# Patient Record
Sex: Male | Born: 1955
Health system: Southern US, Community
[De-identification: ages and names within clinical notes are randomized; demographics above are authoritative.]

## PROBLEM LIST (undated history)

## (undated) DIAGNOSIS — E7800 Pure hypercholesterolemia, unspecified: Secondary | ICD-10-CM

## (undated) DIAGNOSIS — G8929 Other chronic pain: Secondary | ICD-10-CM

## (undated) DIAGNOSIS — Z860101 Personal history of adenomatous and serrated colon polyps: Secondary | ICD-10-CM

## (undated) DIAGNOSIS — N529 Male erectile dysfunction, unspecified: Secondary | ICD-10-CM

## (undated) DIAGNOSIS — S060X9A Concussion with loss of consciousness of unspecified duration, initial encounter: Secondary | ICD-10-CM

## (undated) DIAGNOSIS — N401 Enlarged prostate with lower urinary tract symptoms: Secondary | ICD-10-CM

## (undated) DIAGNOSIS — Z9889 Other specified postprocedural states: Secondary | ICD-10-CM

## (undated) DIAGNOSIS — E785 Hyperlipidemia, unspecified: Secondary | ICD-10-CM

## (undated) DIAGNOSIS — I10 Essential (primary) hypertension: Secondary | ICD-10-CM

## (undated) DIAGNOSIS — M1990 Unspecified osteoarthritis, unspecified site: Secondary | ICD-10-CM

## (undated) DIAGNOSIS — E119 Type 2 diabetes mellitus without complications: Secondary | ICD-10-CM

## (undated) DIAGNOSIS — B1920 Unspecified viral hepatitis C without hepatic coma: Secondary | ICD-10-CM

## (undated) DIAGNOSIS — S199XXA Unspecified injury of neck, initial encounter: Secondary | ICD-10-CM

## (undated) HISTORY — PX: COLONOSCOPY: SHX174

## (undated) HISTORY — DX: Pure hypercholesterolemia, unspecified: E78.00

## (undated) HISTORY — DX: Unspecified viral hepatitis C without hepatic coma: B19.20

## (undated) HISTORY — PX: FINGER SURGERY: SHX640

## (undated) HISTORY — PX: PROSTATE BIOPSY: SHX241

---
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
## 1995-03-18 HISTORY — PX: REVISION AMPUTATION OF FINGER: SHX2346

## 2001-06-28 ENCOUNTER — Ambulatory Visit (HOSPITAL_COMMUNITY): Admission: RE | Admit: 2001-06-28 | Discharge: 2001-06-28 | Payer: Self-pay | Admitting: Family Medicine

## 2001-06-28 ENCOUNTER — Encounter: Payer: Self-pay | Admitting: Family Medicine

## 2001-07-05 ENCOUNTER — Encounter: Payer: Self-pay | Admitting: Family Medicine

## 2001-07-05 ENCOUNTER — Ambulatory Visit (HOSPITAL_COMMUNITY): Admission: RE | Admit: 2001-07-05 | Discharge: 2001-07-05 | Payer: Self-pay | Admitting: Family Medicine

## 2004-07-25 ENCOUNTER — Ambulatory Visit (HOSPITAL_COMMUNITY): Admission: RE | Admit: 2004-07-25 | Discharge: 2004-07-25 | Payer: Self-pay | Admitting: Family Medicine

## 2005-03-17 DIAGNOSIS — Z8619 Personal history of other infectious and parasitic diseases: Secondary | ICD-10-CM

## 2005-03-17 HISTORY — DX: Personal history of other infectious and parasitic diseases: Z86.19

## 2005-11-18 ENCOUNTER — Ambulatory Visit (HOSPITAL_COMMUNITY): Admission: RE | Admit: 2005-11-18 | Discharge: 2005-11-18 | Payer: Self-pay | Admitting: Family Medicine

## 2005-12-02 ENCOUNTER — Ambulatory Visit: Payer: Self-pay | Admitting: Gastroenterology

## 2005-12-02 ENCOUNTER — Ambulatory Visit (HOSPITAL_COMMUNITY): Admission: RE | Admit: 2005-12-02 | Discharge: 2005-12-02 | Payer: Self-pay | Admitting: Gastroenterology

## 2005-12-30 ENCOUNTER — Ambulatory Visit: Payer: Self-pay | Admitting: Gastroenterology

## 2006-02-04 ENCOUNTER — Ambulatory Visit (HOSPITAL_COMMUNITY): Admission: RE | Admit: 2006-02-04 | Discharge: 2006-02-04 | Payer: Self-pay | Admitting: Gastroenterology

## 2006-02-04 ENCOUNTER — Encounter (INDEPENDENT_AMBULATORY_CARE_PROVIDER_SITE_OTHER): Payer: Self-pay | Admitting: Specialist

## 2006-06-09 ENCOUNTER — Ambulatory Visit: Payer: Self-pay | Admitting: Gastroenterology

## 2006-07-23 ENCOUNTER — Ambulatory Visit: Payer: Self-pay | Admitting: Gastroenterology

## 2006-07-30 ENCOUNTER — Ambulatory Visit: Payer: Self-pay | Admitting: Gastroenterology

## 2006-08-13 ENCOUNTER — Ambulatory Visit: Payer: Self-pay | Admitting: Gastroenterology

## 2006-09-03 ENCOUNTER — Ambulatory Visit: Payer: Self-pay | Admitting: Gastroenterology

## 2006-09-15 ENCOUNTER — Ambulatory Visit: Payer: Self-pay | Admitting: Gastroenterology

## 2006-10-15 ENCOUNTER — Ambulatory Visit: Payer: Self-pay | Admitting: Gastroenterology

## 2006-11-11 ENCOUNTER — Ambulatory Visit: Payer: Self-pay | Admitting: Gastroenterology

## 2006-12-10 ENCOUNTER — Ambulatory Visit: Payer: Self-pay | Admitting: Gastroenterology

## 2007-01-12 ENCOUNTER — Ambulatory Visit: Payer: Self-pay | Admitting: Gastroenterology

## 2007-01-12 ENCOUNTER — Ambulatory Visit (HOSPITAL_COMMUNITY): Admission: RE | Admit: 2007-01-12 | Discharge: 2007-01-12 | Payer: Self-pay | Admitting: Gastroenterology

## 2007-02-09 ENCOUNTER — Ambulatory Visit: Payer: Self-pay | Admitting: Gastroenterology

## 2007-03-09 ENCOUNTER — Ambulatory Visit: Payer: Self-pay | Admitting: Gastroenterology

## 2007-04-15 ENCOUNTER — Ambulatory Visit: Payer: Self-pay | Admitting: Gastroenterology

## 2007-04-22 ENCOUNTER — Ambulatory Visit: Payer: Self-pay | Admitting: Gastroenterology

## 2007-05-20 ENCOUNTER — Ambulatory Visit: Payer: Self-pay | Admitting: Gastroenterology

## 2007-06-17 ENCOUNTER — Ambulatory Visit: Payer: Self-pay | Admitting: Gastroenterology

## 2007-07-15 ENCOUNTER — Ambulatory Visit: Payer: Self-pay | Admitting: Gastroenterology

## 2007-08-19 ENCOUNTER — Ambulatory Visit: Payer: Self-pay | Admitting: Gastroenterology

## 2007-09-16 ENCOUNTER — Ambulatory Visit: Payer: Self-pay | Admitting: Gastroenterology

## 2007-10-14 ENCOUNTER — Ambulatory Visit: Payer: Self-pay | Admitting: Gastroenterology

## 2007-11-18 ENCOUNTER — Ambulatory Visit: Payer: Self-pay | Admitting: Gastroenterology

## 2009-07-10 ENCOUNTER — Ambulatory Visit: Payer: Self-pay | Admitting: Orthopedic Surgery

## 2009-07-10 DIAGNOSIS — M67919 Unspecified disorder of synovium and tendon, unspecified shoulder: Secondary | ICD-10-CM | POA: Insufficient documentation

## 2009-07-10 DIAGNOSIS — M719 Bursopathy, unspecified: Secondary | ICD-10-CM

## 2009-07-24 ENCOUNTER — Ambulatory Visit: Payer: Self-pay | Admitting: Orthopedic Surgery

## 2009-07-24 DIAGNOSIS — M75120 Complete rotator cuff tear or rupture of unspecified shoulder, not specified as traumatic: Secondary | ICD-10-CM | POA: Insufficient documentation

## 2009-07-25 ENCOUNTER — Telehealth: Payer: Self-pay | Admitting: Orthopedic Surgery

## 2009-07-27 ENCOUNTER — Ambulatory Visit (HOSPITAL_COMMUNITY): Admission: RE | Admit: 2009-07-27 | Discharge: 2009-07-27 | Payer: Self-pay | Admitting: Orthopedic Surgery

## 2009-08-02 ENCOUNTER — Ambulatory Visit: Payer: Self-pay | Admitting: Orthopedic Surgery

## 2009-08-06 ENCOUNTER — Encounter (HOSPITAL_COMMUNITY): Admission: RE | Admit: 2009-08-06 | Discharge: 2009-09-05 | Payer: Self-pay | Admitting: Orthopedic Surgery

## 2009-08-06 ENCOUNTER — Encounter: Payer: Self-pay | Admitting: Orthopedic Surgery

## 2009-09-24 ENCOUNTER — Encounter: Payer: Self-pay | Admitting: Orthopedic Surgery

## 2010-02-19 ENCOUNTER — Ambulatory Visit: Payer: Self-pay | Admitting: Orthopedic Surgery

## 2010-02-19 DIAGNOSIS — S43499A Other sprain of unspecified shoulder joint, initial encounter: Secondary | ICD-10-CM | POA: Insufficient documentation

## 2010-02-19 DIAGNOSIS — S46819A Strain of other muscles, fascia and tendons at shoulder and upper arm level, unspecified arm, initial encounter: Secondary | ICD-10-CM

## 2010-04-07 ENCOUNTER — Encounter: Payer: Self-pay | Admitting: Gastroenterology

## 2010-04-18 NOTE — Miscellaneous (Signed)
Summary: OT Clinical evaluation  OT Clinical evaluation   Imported By: Jacklynn Ganong 08/30/2009 15:01:46  _____________________________________________________________________  External Attachment:    Type:   Image     Comment:   External Document

## 2010-04-18 NOTE — Assessment & Plan Note (Signed)
Summary: CONSULT/TREAT LT SHOULDER PAIN/?RC TEAR/NEEDS XRAY/REF M.CRES...   Vital Signs:  Patient profile:   55 year old male Height:      74 inches Weight:      215 pounds Pulse rate:   68 / minute Resp:     16 per minute  Vitals Entered By: Fuller Canada MD (July 10, 2009 2:02 PM)  Visit Type:  Initial Consult Referring Provider:  Dr. Nobie Putnam Primary Provider:  Dr. Nobie Putnam  CC:  left shoulder pain.  History of Present Illness: 55 year old male works at [employer] presents with sharp throbbing stabbing burning pain in the LEFT shoulder for 6 months.  Came on gradually worse when he sleeping at try to lift his arm.  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  He does get some relief from Vicodin 5 mg.  Pain level is 6/10.  Will have xrays today.  Meds: Folic Acid, Beta Carotene, B6, Selenium, Fish iol, Saw Palmetto, Hydrocodone 5/500.      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 Cardiovascular:  Denies chest pain, palpitations, fainting, and murmurs. Respiratory:  Denies short of breath, wheezing, couch, tightness, pain on inspiration, and snoring . Gastrointestinal:  Denies heartburn, nausea, vomiting, diarrhea, constipation, and blood in your stools. Genitourinary:  Denies frequency, urgency, difficulty urinating, painful urination, flank pain, and bleeding in urine. Neurologic:  Complains of numbness and tingling; denies unsteady gait, dizziness, tremors, and seizure. Musculoskeletal:  Complains of joint pain and stiffness; denies swelling, instability, redness, heat, and muscle pain. Endocrine:  Denies excessive thirst,  exessive urination, and heat or cold intolerance. Psychiatric:  Denies nervousness, depression, anxiety, and hallucinations. Skin:  Denies changes in the skin, poor healing, rash, itching, and redness. HEENT:  Denies blurred or double vision, eye pain, redness, and watering. Immunology:  Denies seasonal allergies, sinus problems, and allergic to bee stings. Hemoatologic:  Denies easy bleeding and brusing.  Physical Exam  Additional Exam:  GEN: well developed, well nourished, normal grooming and hygiene, no deformity and normal body habitus.   CDV: pulses are normal, no edema, no erythema. no tenderness  Lymph: normal lymph nodes   Skin: no rashes, skin lesions or open sores   NEURO: normal coordination, reflexes, sensation.   Psyche: awake, alert and oriented. Mood normal   Gait: normal  RIGHT shoulder inspection normal, range of motion normal, motor exam normal, dry stability normal,  LEFT shoulder a.c. joint nontender posterior subacromial space tender anterior lateral tenderness in the deltoid area.  Full range of motion but painful impingement sign.  Weakness of Rotation normal strength in internal rotation normal strength in abduction and forward elevation.  Joint was stable      Impression & Recommendations:  Problem # 1:  ROTATOR CUFF SYNDROME, LEFT (ICD-726.10) Assessment New  Orders: Consultation Level III (62130) Joint Aspirate / Injection, Large (20610)/LEFT shoulder Depo- Medrol 40mg  (J1030) Verbal consent obtained/The shoulder was injected with depomedrol 40mg /cc and sensorcaine .25% . There were no complications   X-rays were ordered to views LEFT shoulder  Type II acromion normal glenohumeral joint normal a.c. joint.  Impression normal shoulder  Medications Added to Medication List This Visit: 1)  Norco  5-325 Mg Tabs (Hydrocodone-acetaminophen) .Marland Kitchen.. 1 by mouth q 4 as needed pain  Patient Instructions: 1)  You have received an injection of cortisone  today. You may experience increased pain at the injection site. Apply ice pack to the area for 20 minutes every 2 hours and take 2 xtra strength tylenol every 8 hours. This increased pain will usually resolve in 24 hours. The injection will take effect in 3-10 days.  2)  Please schedule a follow-up appointment in 2 weeks. Prescriptions: NORCO 5-325 MG TABS (HYDROCODONE-ACETAMINOPHEN) 1 by mouth q 4 as needed pain  #84 x 2   Entered and Authorized by:   Fuller Canada MD   Signed by:   Fuller Canada MD on 07/10/2009   Method used:   Print then Give to Patient   RxID:   [id number]

## 2010-04-18 NOTE — Miscellaneous (Signed)
Summary: Discharge from OT  Discharge from OT   Imported By: Jacklynn Ganong 09/26/2009 12:15:36  _____________________________________________________________________  External Attachment:    Type:   Image     Comment:   External Document

## 2010-04-18 NOTE — Progress Notes (Signed)
Summary: MRI appointment  Phone Note Outgoing Call   Call placed by: Waldon Reining,  Jul 25, 2009 4:16 PM Call placed to: Patient Action Taken: Appt scheduled Summary of Call: I scheduled patient's MRI appointment at [facility] on 07-27-09 at 1:30. Patient has BCBS, no precert is needed. Patient will follow up here for his results.

## 2010-04-18 NOTE — Letter (Signed)
Summary: *Orthopedic Consult Note  [facility]  [address] [place] [address]  [place], [place] [address]   Phone: [phone]  Fax: [phone]    Re:    Mitchell Ross DOB:    1956/02/16   Dear: Loraine Leriche   Thank you for requesting that we see the above patient for consultation.  A copy of the detailed office note will be sent under separate cover, for your review.  Evaluation today is consistent with:  1)  ROTATOR CUFF SYNDROME, LEFT (ICD-726.10)   Our recommendation is for: injection subacromial space followup 2 weeks continue Norco 5 mg q.4 p.r.n. pain #84 2 refills      Thank you for this opportunity to look after your patient.  Sincerely,   Terrance Mass. MD.

## 2010-04-18 NOTE — Assessment & Plan Note (Signed)
Summary: 2 WK RE-CK LT SHOULDER/ANTHEM BCBS/CAF   Visit Type:  Follow-up Referring Provider:  Dr. Nobie Putnam Primary Provider:  Dr. Nobie Putnam  CC:  left shoulder pain.  History of Present Illness: I saw Mitchell Ross in the office today for a 2 week  followup visit.  He is a 55 years old man with the complaint of:  left shoulder.  Meds: Folic Acid, Beta Carotene, B6, Selenium, Fish iol, Saw Palmetto, Hydrocodone 5/500.  Last visit, he was given: Norco 5-325 Mg Tabs (Hydrocodone-acetaminophen) .Marland Kitchen.. 1 by mouth q 4 as needed pain Injection in left shoulder.  He says that it started getting better, but now it hurts different, deeper pain.  He continues to have a positive impingement sign.  His range of motion is good but he has pain at 120-180 range of motion.  He has pain with internal rotation.  Shoulder is stable.  His neurovascular intact.  Skin normal.  There is no lymphadenopathy.     Allergies: No Known Drug Allergies  Past History:  Past medical, surgical, family and social histories (including risk factors) reviewed, and no changes noted (except as noted below).  Past Medical History: Reviewed history from 07/10/2009 and no changes required. na  Past Surgical History: Reviewed history from 07/10/2009 and no changes required. rt knee finger  Family History: Reviewed history from 07/10/2009 and no changes required. FH of Cancer:   Social History: Reviewed history from 07/10/2009 and no changes required. 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 Patient Level III (13244)  Problem # 2:  RUPTURE ROTATOR CUFF (ICD-727.61) Assessment: New  MRI rule out rotator cuff tear  Orders: Est. Patient Level III (01027)  Patient Instructions: 1)  MRI LEFT SHOULDER

## 2010-04-18 NOTE — Letter (Signed)
Summary: Generic Letter  [facility]  [address] [place] [address]  [place], [place] [address]   Phone: [phone]  Fax: [phone]    08/02/2009  TRAJON ROSETE [address] [place], [place]  [address]  Dear Mr. CHOYCE,  As per appointment/evaluation today, 08/02/2009:  Per MRI: The diagnosis is rotator cuff tear, left shoulder  (Diagnosis codes: 727.61, 726.10)       Sincerely,   Terrance Mass, MD

## 2010-04-18 NOTE — Assessment & Plan Note (Signed)
Summary: REQ INJECTION/BCBS/BSF   Visit Type:  Follow-up Referring Provider:  Dr. Nobie Putnam Primary Provider:  Dr. Nobie Putnam  CC:  left shoulder pain.  History of Present Illness: This is a 55 year old male previously seen for shoulder pain on the LEFT ventricle. He had an MRI, which showed rotator cuff tendinopathy with partial with tear of the supraspinatus, a.c. joint arthrosis, and bursitis. We advised him to have surgery. We wanted to try nonoperative treatment and had an injection or 2 , physical therapy,  and took pain medication.  He was in his normal state of health until 2 weeks ago, while at work. He felt a pop in the front of his shoulder and pain. He was evaluated by the company nurse and a physical therapist. He was placed on an exercise program, and no further followup was advised.  He presents now with anterior shoulder pain. A catching sensation in the LEFT shoulder and pain depending on the position of the arm  Norco 5 for pain, ran out, helps, Folic Acid, Beta Carotene, B6, Selenium, Fish iol, Saw Palmetto.           Allergies (verified): No Known Drug Allergies  Physical Exam  Additional Exam:  His overall appearance was normal. He was awake, alert, and limited x3. His mood and affect were pleasant.  He had normal ambulation.  His LEFT shoulder was tender anteriorly over the biceps tendon and coracoid. He did not have anterolateral pain. His range of motion was decreased in for elevation and internal rotation normal and external rotation. The shoulder was stable. He did have weakness of his supraspinatus tendon. He also had a positive speeds test for SLAP tear. He had some weakness in his cuff and the skin was normal. Pulses were excellent. There is no lymphadenopathy. Normal sensation was noted in the hand.    Impression & Recommendations:  Problem # 1:  RUPTURE ROTATOR CUFF (ICD-727.61) Assessment Unchanged  his rotator cuff problem seems the same. He  seems to have a new injury. Now.  LEFT shoulder injection  Verbal consent was obtained. The knee was prepped with alcohol and ethyl chloride. 1 cc of depomedrol 40mg /cc and 4 cc of lidocaine 1% was injected. there were no complications.  Orders: Est. Patient Level III ([address])  Problem # 2:  SPRAIN&STRAIN OTH SPEC SITES SHOULDER&UPPER ARM (ICD-840.8) Assessment: New  he seems to have a new SLAP tear. Recommend he see the company doctor or nurse, again to advise them that we think this is a new injury, and we've advised MRI to evaluate this. We can compare this to his previous MRI to confirm pathology going on in his shoulder.  Orders: Est. Patient Level III (60454) Joint Aspirate / Injection, Large (20610) Depo- Medrol 40mg  (J1030)  Patient Instructions: 1)  You have received an injection of cortisone today. You may experience increased pain at the injection site. Apply ice pack to the area for 20 minutes every 2 hours and take 2 xtra strength tylenol every 8 hours. This increased pain will usually resolve in 24 hours. The injection will take effect in 3-10 days.  2)  Please contact the nurse at [facility] to advise that I need to talk to her as this is a different injury  Prescriptions: NORCO 5-325 MG TABS (HYDROCODONE-ACETAMINOPHEN) 1 by mouth q 4 as needed pain  #84 x 2   Entered and Authorized by:   Fuller Canada MD   Signed by:   Fuller Canada MD on 02/19/2010  Method used:   Print then Give to Patient   RxID:   [id number]    Orders Added: 1)  Est. Patient Level III [84132] 2)  Joint Aspirate / Injection, Large [20610] 3)  Depo- Medrol 40mg  [J1030]

## 2010-04-18 NOTE — Letter (Signed)
Summary: History form  History form   Imported By: Jacklynn Ganong 07/16/2009 10:36:38  _____________________________________________________________________  External Attachment:    Type:   Image     Comment:   External Document

## 2010-04-18 NOTE — Assessment & Plan Note (Signed)
Summary: REVIEW MRI RESULTS [facility] BCBS/CAF   Visit Type:  Follow-up Referring Provider:  Dr. Nobie Putnam Primary Provider:  Dr. Nobie Putnam  CC:  mri results left shoulder.  History of Present Illness:   He says that it started getting better, but now it hurts different, deeper pain. Left shoulder pain.  Had cortisone shot in the past helped, 07/10/09 helped for a week.  Norco 5 for pain, helps, Folic Acid, Beta Carotene, B6, Selenium, Fish iol, Saw Palmetto.  He was sent for MRI  IMPRESSION:   1.  Rotator cuff tendinopathy with partial width, full-thickness tear of the supraspinatus tendon. 2.  Acromioclavicular osteoarthritis. 3.  Subacromial/subdeltoid bursitis.    Exam:  He continues to have a positive impingement sign.  His range of motion is good but he has pain at 120-180 range of motion. He has weakness left vs right in the S spinatus  He has pain with internal rotation.  Shoulder is stable.  His neurovascular intact.  Skin normal.  There is no lymphadenopathy.     Allergies (verified): No Known Drug Allergies   Impression & Recommendations:  Problem # 1:  RUPTURE ROTATOR CUFF (ICD-727.61) Assessment Deteriorated  I offered him surgery he preferred non op treatment  Injection left shoulder & Phys TH   Orders: Physical Therapy Referral (PT) Est. Patient Level III (04540) Joint Aspirate / Injection, Large (20610) Depo- Medrol 40mg  (J1030)  Patient Instructions: 1)  You have received an injection of cortisone today. You may experience increased pain at the injection site. Apply ice pack to the area for 20 minutes every 2 hours and take 2 xtra strength tylenol every 8 hours. This increased pain will usually resolve in 24 hours. The injection will take effect in 3-10 days.   2)  Physical Thearpy x 6 weeks   Appended Document: REVIEW MRI RESULTS [facility] SHOULDER/ANTH BCBS/CAF ROSdenied neck pain or numbness

## 2010-08-02 NOTE — Op Note (Signed)
Mitchell Ross, Mitchell Ross               ACCOUNT NO.:  [id number]   MEDICAL RECORD NO.:  [id number]          PATIENT TYPE:  AMB   LOCATION:  DAY                           FACILITY:  [facility]   PHYSICIAN:  Kassie Mends, M.D.      DATE OF BIRTH:  1955/06/24   DATE OF PROCEDURE:  12/02/2005  DATE OF DISCHARGE:                                 OPERATIVE REPORT   PROCEDURE:  Colonoscopy.   INDICATION FOR EXAM:  Mr. Kunath is a 55 year old male who presents for  average risk colon cancer screening.   FINDINGS:  1. Pancolonic diverticulosis most pronounced in the sigmoid colon.      Multiple thickened sigmoid folds.  Otherwise, no polyps, masses,      inflammatory changes or vascular ectasia seen.  2. Small internal hemorrhoids.   RECOMMENDATIONS:  1. High fiber diet and diverticulosis handout given.  2. Follow up with Dr. Nobie Putnam.  3. Screening colonoscopy in ten years.   MEDICATIONS:  1. Demerol 75 mg IV.  2. Versed 5 mg IV.   PROCEDURE TECHNIQUE:  Physical exam was performed and informed consent was  obtained from the patient after explaining the benefits, risks and  alternatives to the procedure.  The patient was connected to the monitor and  placed in the left lateral position.  Continuous oxygen was provided by  nasal cannula and IV medicine administered via indwelling cannula.  After  administration of sedation and rectal exam, the patient's rectum was  intubated.  The scope was advanced under direct visualization to the cecum.  The scope was subsequently removed slowly by carefully examining the color,  texture, anatomy and integrity of the mucosa on the way out.  The patient  was recovered in the endoscopy suite and discharged home in satisfactory  condition.      Kassie Mends, M.D.  Electronically Signed    SM/MEDQ  D:  12/02/2005  T:  12/03/2005  Job:  [id number]   cc:   Patrica Duel, M.D.  Fax: [phone]

## 2011-02-18 ENCOUNTER — Ambulatory Visit (INDEPENDENT_AMBULATORY_CARE_PROVIDER_SITE_OTHER): Payer: BC Managed Care – PPO | Admitting: Orthopedic Surgery

## 2011-02-18 ENCOUNTER — Encounter: Payer: Self-pay | Admitting: Orthopedic Surgery

## 2011-02-18 VITALS — BP 120/90 | Ht 74.0 in | Wt 226.0 lb

## 2011-02-18 DIAGNOSIS — M75102 Unspecified rotator cuff tear or rupture of left shoulder, not specified as traumatic: Secondary | ICD-10-CM

## 2011-02-18 DIAGNOSIS — M719 Bursopathy, unspecified: Secondary | ICD-10-CM

## 2011-02-18 DIAGNOSIS — M67919 Unspecified disorder of synovium and tendon, unspecified shoulder: Secondary | ICD-10-CM

## 2011-02-18 MED ORDER — HYDROCODONE-ACETAMINOPHEN 5-500 MG PO TABS: 1.0000 | ORAL_TABLET | ORAL | Status: AC | PRN

## 2011-02-18 NOTE — Patient Instructions (Signed)
You have received a steroid shot. 15% of patients experience increased pain at the injection site with in the next 24 hours. This is best treated with ice and tylenol extra strength 2 tabs every 8 hours. If you are still having pain please call the office.    

## 2011-02-18 NOTE — Progress Notes (Signed)
LEFT shoulder pain.  Previous history of LEFT shoulder rotator cuff syndrome with partial cuff tear Pusey treated with injection and physical therapy. Approximately a year ago presents back with her current symptoms of LEFT shoulder pain requesting injection LEFT shoulder.  Previous MRI noted below. [2011]  IMPRESSION:  1. Rotator cuff tendinopathy with partial width, full-thickness  tear of the supraspinatus tendon.  2. Acromioclavicular osteoarthritis.  3. Subacromial/subdeltoid bursitis.  C/O pain weakness nocturnal pain and decresed ROM   ROS denies neuro symptoms   Physical Exam(6) GENERAL: normal development   CDV: pulses are normal   Skin: normal  Psychiatric: awake, alert and oriented  Neuro: normal sensation  MSK neck rom normal  1 Painful forward elevation, LEFT shoulder A posterior subacromial soft tissue tenderness. Painful range of motion and 110. Passive range of motion 150. Mild weakness and super supinators. Normal external rotation. Shoulder stability. Normal. Alignment normal. Assessment: Partial rotator cuff tear with impingement syndrome, LEFT shoulder    Plan: Inject LEFT shoulder start vicodin 5 mg q 6 hours p.r.n. pain

## 2011-02-19 ENCOUNTER — Telehealth: Payer: Self-pay | Admitting: Orthopedic Surgery

## 2011-02-19 NOTE — Telephone Encounter (Signed)
Please call [facility] regarding prescription given to Ria Comment yesterday.  Direct # [phone]

## 2011-02-20 NOTE — Telephone Encounter (Signed)
Mitchell Ross spoke with the pharmacy

## 2011-02-20 NOTE — Telephone Encounter (Signed)
Spoke with pharmacy and patient filled percocet from dr fusco same day we gave hime the vicodin script, they did not fill script from here, i advised to cancel since he now has percocet from another provider

## 2011-12-03 ENCOUNTER — Ambulatory Visit (INDEPENDENT_AMBULATORY_CARE_PROVIDER_SITE_OTHER): Payer: BC Managed Care – PPO | Admitting: Orthopedic Surgery

## 2011-12-03 ENCOUNTER — Encounter: Payer: Self-pay | Admitting: Orthopedic Surgery

## 2011-12-03 VITALS — BP 112/70 | Ht 74.0 in | Wt 226.0 lb

## 2011-12-03 DIAGNOSIS — M25519 Pain in unspecified shoulder: Secondary | ICD-10-CM

## 2011-12-03 DIAGNOSIS — M75120 Complete rotator cuff tear or rupture of unspecified shoulder, not specified as traumatic: Secondary | ICD-10-CM

## 2011-12-03 MED ORDER — HYDROCODONE-ACETAMINOPHEN 5-325 MG PO TABS: 1.0000 | ORAL_TABLET | Freq: Four times a day (QID) | ORAL | Status: DC | PRN

## 2011-12-03 NOTE — Patient Instructions (Addendum)
You have received a steroid shot. 15% of patients experience increased pain at the injection site with in the next 24 hours. This is best treated with ice and tylenol extra strength 2 tabs every 8 hours. If you are still having pain please call the office.    

## 2011-12-03 NOTE — Progress Notes (Signed)
Patient ID: Mitchell Ross, male   DOB: 10-26-1955, 56 y.o.   MRN: [id number] Chief Complaint  Patient presents with  . Shoulder Pain    left shoulder pain, requests injection    MRI Documented rotator cuff tear, LEFT shoulder.  Still complaining of shoulder pain does not want to have surgery. Right now.  Requests injection.  LEFT subacromial injection was performed.  Tolerated well.  Patient advised to switch from Endocet to 5 mg of hydrocodone.  Subacromial Shoulder Injection Procedure Note  Pre-operative Diagnosis: left RC Syndrome  Post-operative Diagnosis: same  Indications: pain   Anesthesia: ethyl chloride   Procedure Details   Verbal consent was obtained for the procedure. The shoulder was prepped withalcohol and the skin was anesthetized. A 20 gauge needle was advanced into the subacromial space through posterior approach without difficulty  The space was then injected with 3 ml 1% lidocaine and 1 ml of depomedrol. The injection site was cleansed with isopropyl alcohol and a dressing was applied.  Complications:  None; patient tolerated the procedure well.

## 2012-05-27 DIAGNOSIS — S199XXA Unspecified injury of neck, initial encounter: Secondary | ICD-10-CM

## 2012-05-27 DIAGNOSIS — S060X9A Concussion with loss of consciousness of unspecified duration, initial encounter: Secondary | ICD-10-CM

## 2012-05-27 DIAGNOSIS — Z87820 Personal history of traumatic brain injury: Secondary | ICD-10-CM

## 2012-05-27 HISTORY — DX: Personal history of traumatic brain injury: Z87.820

## 2012-05-27 HISTORY — DX: Concussion with loss of consciousness of unspecified duration, initial encounter: S06.0X9A

## 2012-05-27 HISTORY — DX: Unspecified injury of neck, initial encounter: S19.9XXA

## 2012-07-06 ENCOUNTER — Encounter (INDEPENDENT_AMBULATORY_CARE_PROVIDER_SITE_OTHER): Payer: Self-pay | Admitting: *Deleted

## 2012-07-14 ENCOUNTER — Ambulatory Visit: Payer: BC Managed Care – PPO | Admitting: Gastroenterology

## 2012-07-20 ENCOUNTER — Encounter (INDEPENDENT_AMBULATORY_CARE_PROVIDER_SITE_OTHER): Payer: Self-pay | Admitting: Internal Medicine

## 2012-07-20 ENCOUNTER — Ambulatory Visit (INDEPENDENT_AMBULATORY_CARE_PROVIDER_SITE_OTHER): Payer: Worker's Compensation | Admitting: Internal Medicine

## 2012-07-20 VITALS — BP 142/84 | HR 76 | Ht 76.0 in | Wt 224.6 lb

## 2012-07-20 DIAGNOSIS — E7800 Pure hypercholesterolemia, unspecified: Secondary | ICD-10-CM | POA: Insufficient documentation

## 2012-07-20 DIAGNOSIS — B1920 Unspecified viral hepatitis C without hepatic coma: Secondary | ICD-10-CM | POA: Insufficient documentation

## 2012-07-20 NOTE — Patient Instructions (Addendum)
Labs. OV pending. 

## 2012-07-20 NOTE — Progress Notes (Signed)
Subjective:     Patient ID: Mitchell Ross, male   DOB: 01/25/1956, 57 y.o.   MRN: [id number] Patient brought list of medications but no dosage were on the list. Please note. HPI Referred to our office by Dr. Sherwood Gambler for treatment of Hepatitis C. He was diagnosed in 2007. Underwent treatment in 2008-2009 in [place]. He cleared the virus during tx.  He had tx failure after one year per patient. 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 Also has one tattoo received in the 1980s while in the service. 06/08/2012 Hep C antibody: reactive. Hep B surface antigen negative. Hepatitis A antibody, IgM: negative TSH 1.643, PSA 1.30 H and H 15.5 and 45.5, MV 83.6, WBC 5.1, Platelet ct 316 ALP 56, AST 38, ALT 45.   02/04/06 Liver Biopsy:   LIVER, NEEDLE CORE BIOPSIES: CHRONIC MILDLY ACTIVE HEPATITIS CONSISTENT WITH HEPATITIS C CHARACTERIZED BY, MILD ACTIVITY, GRADE 2, NO INCREASED FIBROSIS, STAGE 0.    Review of Systems     Current Outpatient Prescriptions  Medication Sig Dispense Refill  . aspirin 81 MG tablet Take 325 mg by mouth 2 (two) times daily before a meal.      . beta carotene 15 MG capsule Take by mouth daily.      Marland Kitchen Cod Liver Oil CAPS Take by mouth daily.      . fish oil-omega-3 fatty acids 1000 MG capsule Take by mouth daily.      . Flaxseed, Linseed, (FLAXSEED OIL PO) Take by mouth.      . folic acid (FOLVITE) 1 MG tablet Take by mouth daily.      Marland Kitchen HYDROcodone-acetaminophen (NORCO) 5-325 MG per tablet Take 1 tablet by mouth every 6 (six) hours as needed for pain.  60 tablet  5  . ibuprofen (ADVIL,MOTRIN) 200 MG tablet Take 200 mg by mouth every 6 (six) hours as needed for pain.      Marland Kitchen LYCOPENE PO Take by mouth.      . multivitamin-iron-minerals-folic acid (CENTRUM) chewable tablet Chew 1 tablet by mouth daily.      . pravastatin (PRAVACHOL) 20 MG tablet Take 20 mg by mouth daily.      . Pyridoxine HCl (VITAMIN B-6) 500 MG  tablet Take by mouth daily.      . saw palmetto 160 MG capsule Take by mouth 2 (two) times daily.      Marland Kitchen selenium 50 MCG TABS Take by mouth daily.      Marland Kitchen zinc gluconate 50 MG tablet Take by mouth daily.       No current facility-administered medications for this visit.   Past Medical History  Diagnosis Date  . High cholesterol   . Hepatitis C    No Known Allergies  Objective:   Physical Exam  Filed Vitals:   07/20/12 1545  BP: 142/84  Pulse: 76  Height: 6\' 4"  (1.93 m)  Weight: 224 lb 9.6 oz (101.878 kg)  Alert and oriented. Skin warm and dry. Oral mucosa is moist.   . Sclera anicteric, conjunctivae is pink. Thyroid not enlarged. No cervical lymphadenopathy. Lungs clear. Heart regular rate and rhythm.  Abdomen is soft. Bowel sounds are positive. No hepatomegaly. No abdominal masses felt. No tenderness.  No edema to lower extremities.       Assessment:    Hepatitis C. Diagnosed ?2007. Underwent tx in 2008-2009 and cleared the virus. One year later he had a viral load.  Has not been retreated.     Plan:   Hep C Quant. Hep C genotype, PT/INR, AFP, US liver.  If liver shows minimal fibrosis, will wait till the first of the year to tx. If changes of cirrhosis, will need a liver biopsy before treatment.  All questions were answered to the best of my knowledge. Office visit pending results of his labs and [place].

## 2012-07-21 ENCOUNTER — Encounter (INDEPENDENT_AMBULATORY_CARE_PROVIDER_SITE_OTHER): Payer: Self-pay

## 2012-07-21 LAB — PROTIME-INR: Prothrombin Time: 12.3 seconds (ref 11.6–15.2)

## 2012-07-22 LAB — HEPATITIS C RNA QUANTITATIVE: HCV Quantitative Log: 5.54 {Log} — ABNORMAL HIGH (ref <1.18)

## 2012-07-28 ENCOUNTER — Telehealth (INDEPENDENT_AMBULATORY_CARE_PROVIDER_SITE_OTHER): Payer: Self-pay | Admitting: *Deleted

## 2012-07-28 ENCOUNTER — Other Ambulatory Visit (INDEPENDENT_AMBULATORY_CARE_PROVIDER_SITE_OTHER): Payer: Self-pay | Admitting: Internal Medicine

## 2012-07-28 ENCOUNTER — Other Ambulatory Visit (HOSPITAL_COMMUNITY): Payer: BC Managed Care – PPO

## 2012-07-28 DIAGNOSIS — B1920 Unspecified viral hepatitis C without hepatic coma: Secondary | ICD-10-CM

## 2012-07-28 NOTE — Telephone Encounter (Signed)
Will come by office to get lab slip

## 2012-07-28 NOTE — Addendum Note (Signed)
Addended by: Len Blalock on: 07/28/2012 09:09 AM   Modules accepted: Orders

## 2012-07-29 ENCOUNTER — Ambulatory Visit (HOSPITAL_COMMUNITY)
Admission: RE | Admit: 2012-07-29 | Discharge: 2012-07-29 | Disposition: A | Discharge status: Home / Self Care | Payer: BC Managed Care – PPO | Source: Ambulatory Visit | Attending: Internal Medicine | Admitting: Internal Medicine

## 2012-07-29 DIAGNOSIS — N281 Cyst of kidney, acquired: Secondary | ICD-10-CM | POA: Insufficient documentation

## 2012-07-29 DIAGNOSIS — B1920 Unspecified viral hepatitis C without hepatic coma: Secondary | ICD-10-CM | POA: Insufficient documentation

## 2012-07-30 LAB — HEPATITIS C GENOTYPE

## 2012-11-24 ENCOUNTER — Other Ambulatory Visit (HOSPITAL_COMMUNITY): Payer: Self-pay | Admitting: Physician Assistant

## 2012-11-24 DIAGNOSIS — R209 Unspecified disturbances of skin sensation: Secondary | ICD-10-CM

## 2012-11-24 DIAGNOSIS — R269 Unspecified abnormalities of gait and mobility: Secondary | ICD-10-CM

## 2012-11-24 DIAGNOSIS — G8929 Other chronic pain: Secondary | ICD-10-CM

## 2012-11-26 ENCOUNTER — Encounter (HOSPITAL_COMMUNITY): Payer: Self-pay

## 2012-11-26 ENCOUNTER — Ambulatory Visit (HOSPITAL_COMMUNITY)
Admission: RE | Admit: 2012-11-26 | Discharge: 2012-11-26 | Disposition: A | Discharge status: Home / Self Care | Payer: Worker's Compensation | Source: Ambulatory Visit | Attending: Physician Assistant | Admitting: Physician Assistant

## 2012-11-26 DIAGNOSIS — R209 Unspecified disturbances of skin sensation: Secondary | ICD-10-CM

## 2012-11-26 DIAGNOSIS — R5381 Other malaise: Secondary | ICD-10-CM | POA: Diagnosis not present

## 2012-11-26 DIAGNOSIS — R262 Difficulty in walking, not elsewhere classified: Secondary | ICD-10-CM | POA: Insufficient documentation

## 2012-11-26 DIAGNOSIS — G8929 Other chronic pain: Secondary | ICD-10-CM

## 2012-11-26 DIAGNOSIS — R269 Unspecified abnormalities of gait and mobility: Secondary | ICD-10-CM

## 2012-12-15 ENCOUNTER — Ambulatory Visit (INDEPENDENT_AMBULATORY_CARE_PROVIDER_SITE_OTHER): Payer: BC Managed Care – PPO | Admitting: Neurology

## 2012-12-15 ENCOUNTER — Encounter: Payer: Self-pay | Admitting: Neurology

## 2012-12-15 VITALS — BP 148/78 | HR 70 | Temp 98.0°F | Ht 76.0 in | Wt 214.0 lb

## 2012-12-15 DIAGNOSIS — R269 Unspecified abnormalities of gait and mobility: Secondary | ICD-10-CM

## 2012-12-15 DIAGNOSIS — R531 Weakness: Secondary | ICD-10-CM

## 2012-12-15 DIAGNOSIS — M6281 Muscle weakness (generalized): Secondary | ICD-10-CM

## 2012-12-15 NOTE — Patient Instructions (Addendum)
1.  We will get an MRI of the brain to look for cause of walking problems and right sided weakness.  If this is not revealing, we will then get MRI of cervical spine (neck). 2.  We will check blood for some causes of numbness. 3.  Follow up in 1 month.  Further testing pending results of MRI.  Your MRI brain is scheduled at [facility] on Monday, October 6th at 4:45 pm. Please arrive 15 minutes prior to your appointment.  [phone].  Follow up in our office in one month.

## 2012-12-15 NOTE — Progress Notes (Signed)
NEUROLOGY CONSULTATION NOTE  WOJCIECH WILLETTS MRN: [id number] DOB: 03-Jun-1955  Referring provider: Dr. Phillips Odor Primary care provider: Dr. Phillips Odor  Reason for consult:  Gait disorder  HISTORY OF PRESENT ILLNESS: Mitchell Ross is a 57 year old right-handed man with Hepatitis C, hyperlipidemia, chronic pain syndrome, anxiety and left rotator cuff syndrome who presents for right sided weakness and gait instability.  Records and images were personally reviewed where available.    He had a fall and concussion in March 2014.  He reportedly fell over some equipment at work and sustained a closed head injury with loss of consciousness.  Reportedly CT of head at that time revealed "possible old stroke" but was otherwise unremarkable.  He developed upper back and shoulder pain, right greater than left, described as "burning".  But office note at that time reveals he did not have any neurological issues.  Since that time, he has felt that his right side is weaker.  He feels that he drags his right leg more.  He feels unsteady.  When he is in the shower,he has to hold on to the wall.  He also feels that his right arm is heavy and can't use his hand and arm like he wants to.  It affects his writing.  He denies shooting pain from the neck down his arm and from the back down his leg.  He notes numbness in his fingertips, particularly the first 3 digits of both hands.  It is constant and not associated with position or activity.  He also endorses some numbness in his toes.  He denies headache, sleep problems, depression, visual changes, dizziness, dysphagia, speech and language difficulty.  Symptoms have been fairly stable over the past few months.  He had a repeat CT of the head earlier this month, which was unremarkable.  11/26/2012 CT HEAD WO: looks unremarkable.  07/06/01 MRI C-SPINE: LEFT FORAMINAL DISK HERNIATION C5-6. CENTRAL DISK PROTRUSIONS, C4-5, C3-4. NEURAL FORAMINAL STENOSIS BILATERALLY C4-5, LEFT C3-4.  CLINICAL CORRELATION FOR SYMPTOMS IN THE LEFT C6 AND  BILATERAL C5 DISTRIBUTIONS RECOMMENDED.   PAST MEDICAL HISTORY: Past Medical History  Diagnosis Date  . High cholesterol   . Hepatitis C     PAST SURGICAL HISTORY: Past Surgical History  Procedure Laterality Date  . Torn right knee cartiledge  1970's    MEDICATIONS: Current Outpatient Prescriptions on File Prior to Visit  Medication Sig Dispense Refill  . aspirin 81 MG tablet Take 325 mg by mouth 2 (two) times daily before a meal.      . beta carotene 15 MG capsule Take by mouth daily.      Marland Kitchen Cod Liver Oil CAPS Take by mouth daily.      . fish oil-omega-3 fatty acids 1000 MG capsule Take by mouth daily.      . Flaxseed, Linseed, (FLAXSEED OIL PO) Take by mouth.      . folic acid (FOLVITE) 1 MG tablet Take by mouth daily.      Marland Kitchen HYDROcodone-acetaminophen (NORCO) 5-325 MG per tablet Take 1 tablet by mouth every 6 (six) hours as needed for pain.  60 tablet  5  . ibuprofen (ADVIL,MOTRIN) 200 MG tablet Take 200 mg by mouth every 6 (six) hours as needed for pain.      Marland Kitchen LYCOPENE PO Take by mouth.      . multivitamin-iron-minerals-folic acid (CENTRUM) chewable tablet Chew 1 tablet by mouth daily.      . pravastatin (PRAVACHOL) 20 MG tablet Take 20 mg by  mouth daily.      . Pyridoxine HCl (VITAMIN B-6) 500 MG tablet Take by mouth daily.      . saw palmetto 160 MG capsule Take by mouth 2 (two) times daily.      Marland Kitchen selenium 50 MCG TABS Take by mouth daily.      Marland Kitchen zinc gluconate 50 MG tablet Take by mouth daily.       No current facility-administered medications on file prior to visit.    ALLERGIES: No Known Allergies  FAMILY HISTORY: Family History  Problem Relation Age of Onset  . Cancer    . Arthritis      SOCIAL HISTORY: History   Social History  . Marital Status: Single    Spouse Name: N/A    Number of Children: N/A  . Years of Education: N/A   Occupational History  . Not on file.   Social History Main Topics   . Smoking status: Current Every Day Smoker  . Smokeless tobacco: Never Used     Comment: 1/2 pack a day  . Alcohol Use: Yes     Comment: occasionally  . Drug Use: No  . Sexual Activity: Not on file   Other Topics Concern  . Not on file   Social History Narrative  . No narrative on file    REVIEW OF SYSTEMS: Constitutional: No fevers, chills, or sweats, no generalized fatigue, change in appetite Eyes: No visual changes, double vision, eye pain Ear, nose and throat: No hearing loss, ear pain, nasal congestion, sore throat Cardiovascular: No chest pain, palpitations Respiratory:  No shortness of breath at rest or with exertion, wheezes GastrointestinaI: No nausea, vomiting, diarrhea, abdominal pain, fecal incontinence Genitourinary:  No dysuria, urinary retention or frequency Musculoskeletal:  No neck pain, back pain Integumentary: No rash, pruritus, skin lesions Neurological: as above Psychiatric: No depression, insomnia, anxiety Endocrine: No palpitations, fatigue, diaphoresis, mood swings, change in appetite, change in weight, increased thirst Hematologic/Lymphatic:  No anemia, purpura, petechiae. Allergic/Immunologic: no itchy/runny eyes, nasal congestion, recent allergic reactions, rashes  PHYSICAL EXAM: Filed Vitals:   12/15/12 1431  BP: 148/78  Pulse: 70  Temp: 98 F (36.7 C)   General: No acute distress Head:  Normocephalic/atraumatic Neck: supple, no paraspinal tenderness, full range of motion Back: No paraspinal tenderness Heart: regular rate and rhythm Lungs: Clear to auscultation bilaterally. Vascular: No carotid bruits. Neurological Exam: Mental status: alert and oriented to person, place, and time, speech fluent and not dysarthric, language intact. Cranial nerves: CN I: not tested CN II: pupils equal, round and reactive to light, visual fields intact, fundi unremarkable. CN III, IV, VI:  full range of motion, no nystagmus, no ptosis CN V: facial  sensation intact CN VII: upper and lower face symmetric CN VIII: hearing intact CN IX, X: gag intact, uvula midline CN XI: sternocleidomastoid and trapezius muscles intact CN XII: tongue midline Bulk & Tone: normal, no fasciculations. Motor: maybe trace weakness in right deltoid abduction but not too impressive.  Otherwise, 5/5 throughout.  Bilateral finger-thumb tapping symmetric with equal speed and amplitude. Sensation: endorses reduced pinprick in first 3 digits of both hands.  Vibration intact. Deep Tendon Reflexes: 3+ right biceps, brachioradialis, and patellar.  Otherwise 2+.  Toes down bilaterally Finger to nose testing: no dysmetria Heel to shin: mild dysmetria on left. Gait: antalgic gait but not ataxic.  Difficulty walking on heels, toes, and in tandem. Romberg with mild sway.  IMPRESSION: Gait difficulty.  No appreciable objective right sided weakness  noted.  However, he does have asymmetrically brisk deep tendon reflexes on the right, which may suggest upper motor neuron lesion.  No appreciable objective sensory deficits noted in the feet, but the finger numbness may be either carpal tunnel syndrome or cervical radiculopathy (old MRI of C-spine from 2003 did reveal stenosis).  PLAN: 1.  I would first get MRI of the brain to look for intracranial lesion. 2.  If MRI brain unrevealing, would get MRI of cervical spine, to look for evidence of a myelopathy.  The bilateral numbness may be a separate issue, but only a cord lesion would likely give bilateral symptoms (not intracranial lesion). 3.  In the meantime, we will check labs looking for potential causes of numbness (B12, methylmalonic acid, TSH). 4.  Follow up in one month or until tests are completed.  45 minutes spent with patient, over 50% spent counseling and coordinating care.  Thank you for allowing me to take part in the care of this patient.  Shon Millet, DO  CC:  Assunta Found, MD

## 2012-12-17 ENCOUNTER — Telehealth: Payer: Self-pay | Admitting: Neurology

## 2012-12-17 NOTE — Telephone Encounter (Signed)
Message copied by Benay Spice on Fri Dec 17, 2012  4:25 PM ------      Message from: JAFFE, ADAM R      Created: Fri Dec 17, 2012  6:01 AM        Please let Mr. Mitchell Ross know that his blood tests look okay.      ----- Message -----         From: [facility] Interface         Sent: 12/16/2012   6:35 PM           To: Cira Servant, DO                   ------

## 2012-12-17 NOTE — Telephone Encounter (Signed)
Left patient a message on mobile phone that all labs results were normal.

## 2012-12-20 ENCOUNTER — Ambulatory Visit (HOSPITAL_COMMUNITY)
Admission: RE | Admit: 2012-12-20 | Discharge: 2012-12-20 | Disposition: A | Discharge status: Home / Self Care | Payer: Worker's Compensation | Source: Ambulatory Visit | Attending: Neurology | Admitting: Neurology

## 2012-12-20 DIAGNOSIS — R51 Headache: Secondary | ICD-10-CM | POA: Diagnosis not present

## 2012-12-20 DIAGNOSIS — R269 Unspecified abnormalities of gait and mobility: Secondary | ICD-10-CM | POA: Diagnosis not present

## 2012-12-20 DIAGNOSIS — R531 Weakness: Secondary | ICD-10-CM

## 2012-12-20 DIAGNOSIS — M542 Cervicalgia: Secondary | ICD-10-CM | POA: Diagnosis not present

## 2012-12-21 ENCOUNTER — Other Ambulatory Visit: Payer: Self-pay | Admitting: Neurology

## 2012-12-21 ENCOUNTER — Telehealth: Payer: Self-pay | Admitting: Neurology

## 2012-12-21 DIAGNOSIS — R269 Unspecified abnormalities of gait and mobility: Secondary | ICD-10-CM

## 2012-12-21 NOTE — Telephone Encounter (Signed)
Message copied by Benay Spice on Tue Dec 21, 2012  2:59 PM ------      Message from: JAFFE, ADAM R      Created: Tue Dec 21, 2012  6:01 AM       MRI of the brain looks okay.  I would proceed with MRI of cervical spine w/o contrast to evaluate for abnormal gait and upper motor neuron signs.      ----- Message -----         From: Rad Results In Interface         Sent: 12/20/2012   8:30 PM           To: Cira Servant, DO                   ------

## 2012-12-21 NOTE — Telephone Encounter (Signed)
Spoke with the patient. Informed MRI brain normal. Will go forward with MRI c-spine. I will schedule and call him back with appointment date and time. MRI C-spine scheduled at [facility] as pt requested; Friday, October 10th at 10:00 am to arrive at 9:45 am. The patient should call [phone] to reschedule if this is not a good time. Will await his return call

## 2012-12-22 NOTE — Telephone Encounter (Signed)
Patient called to say say he received my message about the MRI.

## 2012-12-22 NOTE — Telephone Encounter (Signed)
Left the patient a message re: MRI appointment date and time. Asked that he call me back to confirm receipt of my message.

## 2012-12-24 ENCOUNTER — Ambulatory Visit (HOSPITAL_COMMUNITY)
Admission: RE | Admit: 2012-12-24 | Discharge: 2012-12-24 | Disposition: A | Discharge status: Home / Self Care | Payer: Worker's Compensation | Source: Ambulatory Visit | Attending: Neurology | Admitting: Neurology

## 2012-12-24 DIAGNOSIS — M4802 Spinal stenosis, cervical region: Secondary | ICD-10-CM | POA: Diagnosis not present

## 2012-12-24 DIAGNOSIS — M5000 Cervical disc disorder with myelopathy, unspecified cervical region: Secondary | ICD-10-CM | POA: Insufficient documentation

## 2012-12-24 DIAGNOSIS — R269 Unspecified abnormalities of gait and mobility: Secondary | ICD-10-CM

## 2012-12-24 DIAGNOSIS — M542 Cervicalgia: Secondary | ICD-10-CM | POA: Diagnosis present

## 2012-12-27 ENCOUNTER — Telehealth: Payer: Self-pay | Admitting: Neurology

## 2012-12-27 ENCOUNTER — Other Ambulatory Visit: Payer: Self-pay | Admitting: Neurology

## 2012-12-27 DIAGNOSIS — R531 Weakness: Secondary | ICD-10-CM

## 2012-12-27 DIAGNOSIS — M542 Cervicalgia: Secondary | ICD-10-CM

## 2012-12-27 NOTE — Telephone Encounter (Signed)
Spoke with Mitchell Ross regarding MRI findings of Cervical spine.  Scan reveals disc compressing the cervical spinal cord.  This is likely causing his symptoms.  Will refer patient to neurosurgery.  I answered all questions to his satisfaction.

## 2012-12-27 NOTE — Telephone Encounter (Signed)
Referral faxed to [facility]

## 2013-01-13 ENCOUNTER — Other Ambulatory Visit: Payer: Self-pay | Admitting: Neurosurgery

## 2013-01-14 ENCOUNTER — Encounter (HOSPITAL_COMMUNITY): Payer: Self-pay | Admitting: Pharmacist

## 2013-01-18 ENCOUNTER — Ambulatory Visit: Payer: BC Managed Care – PPO | Admitting: Neurology

## 2013-01-19 ENCOUNTER — Encounter (HOSPITAL_COMMUNITY)
Admission: RE | Admit: 2013-01-19 | Discharge: 2013-01-19 | Disposition: A | Discharge status: Home / Self Care | Payer: Worker's Compensation | Source: Ambulatory Visit | Attending: Neurosurgery | Admitting: Neurosurgery

## 2013-01-19 ENCOUNTER — Encounter (HOSPITAL_COMMUNITY): Payer: Self-pay

## 2013-01-19 ENCOUNTER — Other Ambulatory Visit (HOSPITAL_COMMUNITY): Payer: Self-pay | Admitting: *Deleted

## 2013-01-19 DIAGNOSIS — M4712 Other spondylosis with myelopathy, cervical region: Secondary | ICD-10-CM | POA: Diagnosis present

## 2013-01-19 DIAGNOSIS — Z79899 Other long term (current) drug therapy: Secondary | ICD-10-CM | POA: Diagnosis not present

## 2013-01-19 DIAGNOSIS — Z01810 Encounter for preprocedural cardiovascular examination: Secondary | ICD-10-CM | POA: Diagnosis not present

## 2013-01-19 DIAGNOSIS — M5000 Cervical disc disorder with myelopathy, unspecified cervical region: Secondary | ICD-10-CM | POA: Diagnosis not present

## 2013-01-19 DIAGNOSIS — I10 Essential (primary) hypertension: Secondary | ICD-10-CM | POA: Diagnosis not present

## 2013-01-19 DIAGNOSIS — Z01818 Encounter for other preprocedural examination: Secondary | ICD-10-CM | POA: Diagnosis not present

## 2013-01-19 HISTORY — DX: Unspecified osteoarthritis, unspecified site: M19.90

## 2013-01-19 HISTORY — DX: Concussion with loss of consciousness of unspecified duration, initial encounter: S06.0X9A

## 2013-01-19 HISTORY — DX: Other specified postprocedural states: Z98.890

## 2013-01-19 HISTORY — DX: Unspecified injury of neck, initial encounter: S19.9XXA

## 2013-01-19 LAB — COMPREHENSIVE METABOLIC PANEL
ALT: 53 U/L (ref 0–53)
AST: 39 U/L — ABNORMAL HIGH (ref 0–37)
Albumin: 3.7 g/dL (ref 3.5–5.2)
Alkaline Phosphatase: 61 U/L (ref 39–117)
Calcium: 9.2 mg/dL (ref 8.4–10.5)
Sodium: 140 mEq/L (ref 135–145)
Total Bilirubin: 0.4 mg/dL (ref 0.3–1.2)
Total Protein: 7.9 g/dL (ref 6.0–8.3)

## 2013-01-19 LAB — CBC
MCH: 29.9 pg (ref 26.0–34.0)
MCHC: 33.9 g/dL (ref 30.0–36.0)
Platelets: 282 10*3/uL (ref 150–400)

## 2013-01-19 MED ORDER — CEFAZOLIN SODIUM-DEXTROSE 2-3 GM-% IV SOLR
2.0000 g | INTRAVENOUS | Status: AC
  Administered 2013-01-20: 2 g via INTRAVENOUS
  Filled 2013-01-19: qty 50

## 2013-01-19 NOTE — Pre-Procedure Instructions (Signed)
Mitchell Ross  01/19/2013   Your procedure is scheduled on:  Thursday, January 20, 2013 at 4:10 PM.   Report to [facility] Entrance "A"  at 1:15 PM.   Call this number if you have problems the morning of surgery: [phone]   Remember:   Do not eat food or drink liquids after midnight tonight, 01/19/13.   Take these medicines the morning of surgery with A SIP OF WATER: oxyCODONE-acetaminophen (PERCOCET)    Do not wear jewelry.  Do not wear lotions, powders, or cologne. You may wear deodorant.             Men may shave face and neck.  Do not bring valuables to the hospital.  [facility] is not responsible                  for any belongings or valuables.               Contacts, dentures or bridgework may not be worn into surgery.  Leave suitcase in the car. After surgery it may be brought to your room.  For patients admitted to the hospital, discharge time is determined by your                treatment team.               Special Instructions: Shower using CHG 2 nights before surgery and the night before surgery.  If you shower the day of surgery use CHG.  Use special wash - you have one bottle of CHG for all showers.  You should use approximately 1/3 of the bottle for each shower.   Please read over the following fact sheets that you were given: Pain Booklet, Coughing and Deep Breathing, MRSA Information and Surgical Site Infection Prevention

## 2013-01-19 NOTE — Progress Notes (Signed)
Pt's BP is elevated today. He states he does not have a hx of HTN. Rechecked it after appt and it is still elevated. Having pt wait and talk with Revonda Standard, [place]. I've called [facility] and they are going to fax his last OV notes and EKG from 2012 to [place] now.

## 2013-01-19 NOTE — Progress Notes (Signed)
Anesthesia PAT Evaluation:  Patient is a 57 year old male scheduled for C3-4 ACDF tomorrow (01/20/13) by Dr. Venetia Maxon.  History includes hepatitis C, fall with concussion 05/2012, prostate biopsy, smoking, hypercholesterolemia. PCP is Dr. Sherwood Gambler.  Last GI visit was on 5/60/14 with Dorene Ar, NP.  I saw patient during his PAT visit because initial BP readings on arrival were 146/101 and 167/110.  Patient says his BP has never been this high and he has never required medication for HTN.  His PAT called Dr. Sharyon Medicus office.  His notes are pending, but she was told that his last BP there on 12/21/12 was 148/88.  We rechecked patient's BP again and it was down to 158/90.  Patient denies chest pain, SOB. 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  Heart RRR, no significant murmurs noted.  Lungs clear.  EKG ordered due to elevated BP and showed NSR.  Abdominal ultrasound on 07/29/12 showed: IMPRESSION: There is increased echogenicity of the hepatic parenchymal echotexture. No hepatic mass is evident. No definite  surface nodularity is seen. Hepatic veins appear patent. Portal vein is patent with hepatopetal flow. Simple cyst of left kidney. Atherosclerotic plaquing of abdominal aorta.  Preoperative labs noted.    His BP was down to 158/90 when rechecked which would be acceptable for OR.  If his BP is reasonable tomorrow and otherwise no acute changes then I would anticipate that he could proceed as planned.  Velna [facility]chs [facility] Short Stay Center/Anesthesiology Phone [phone] 01/19/2013 3:04 PM

## 2013-01-20 ENCOUNTER — Encounter (HOSPITAL_COMMUNITY): Payer: Worker's Compensation | Admitting: Vascular Surgery

## 2013-01-20 ENCOUNTER — Observation Stay (HOSPITAL_COMMUNITY): Payer: Worker's Compensation

## 2013-01-20 ENCOUNTER — Observation Stay (HOSPITAL_COMMUNITY)
Admission: RE | Admit: 2013-01-20 | Discharge: 2013-01-21 | Disposition: A | Discharge status: Home / Self Care | Payer: Worker's Compensation | Source: Ambulatory Visit | Attending: Neurosurgery | Admitting: Neurosurgery

## 2013-01-20 ENCOUNTER — Encounter (HOSPITAL_COMMUNITY): Payer: Self-pay | Admitting: Surgery

## 2013-01-20 ENCOUNTER — Encounter (HOSPITAL_COMMUNITY)
Admission: RE | Disposition: A | Discharge status: Home / Self Care | Payer: Self-pay | Source: Ambulatory Visit | Attending: Neurosurgery

## 2013-01-20 ENCOUNTER — Ambulatory Visit (HOSPITAL_COMMUNITY): Payer: Worker's Compensation | Admitting: Certified Registered"

## 2013-01-20 DIAGNOSIS — Z01818 Encounter for other preprocedural examination: Secondary | ICD-10-CM | POA: Insufficient documentation

## 2013-01-20 DIAGNOSIS — I10 Essential (primary) hypertension: Secondary | ICD-10-CM | POA: Insufficient documentation

## 2013-01-20 DIAGNOSIS — Z79899 Other long term (current) drug therapy: Secondary | ICD-10-CM | POA: Insufficient documentation

## 2013-01-20 DIAGNOSIS — M4712 Other spondylosis with myelopathy, cervical region: Secondary | ICD-10-CM | POA: Diagnosis not present

## 2013-01-20 DIAGNOSIS — M5000 Cervical disc disorder with myelopathy, unspecified cervical region: Secondary | ICD-10-CM | POA: Insufficient documentation

## 2013-01-20 DIAGNOSIS — Z01810 Encounter for preprocedural cardiovascular examination: Secondary | ICD-10-CM | POA: Insufficient documentation

## 2013-01-20 HISTORY — PX: ANTERIOR CERVICAL DECOMP/DISCECTOMY FUSION: SHX1161

## 2013-01-20 SURGERY — ANTERIOR CERVICAL DECOMPRESSION/DISCECTOMY FUSION 1 LEVEL
Anesthesia: General | Site: Neck | Wound class: Clean

## 2013-01-20 MED ORDER — DIAZEPAM 5 MG PO TABS
5.0000 mg | ORAL_TABLET | Freq: Four times a day (QID) | ORAL | Status: DC | PRN
  Administered 2013-01-20: 5 mg via ORAL

## 2013-01-20 MED ORDER — ARTIFICIAL TEARS OP OINT
TOPICAL_OINTMENT | OPHTHALMIC | Status: DC | PRN
  Administered 2013-01-20: 1 via OPHTHALMIC

## 2013-01-20 MED ORDER — HYDROMORPHONE HCL PF 1 MG/ML IJ SOLN
INTRAMUSCULAR | Status: AC
  Filled 2013-01-20: qty 1

## 2013-01-20 MED ORDER — OXYCODONE-ACETAMINOPHEN 10-325 MG PO TABS: 1.0000 | ORAL_TABLET | ORAL | Status: DC | PRN

## 2013-01-20 MED ORDER — LIDOCAINE HCL (CARDIAC) 20 MG/ML IV SOLN
INTRAVENOUS | Status: DC | PRN
  Administered 2013-01-20: 100 mg via INTRAVENOUS

## 2013-01-20 MED ORDER — OMEGA-3 FATTY ACIDS 1000 MG PO CAPS: 1.0000 g | ORAL_CAPSULE | Freq: Every day | ORAL | Status: DC

## 2013-01-20 MED ORDER — HYDROCODONE-ACETAMINOPHEN 5-325 MG PO TABS
1.0000 | ORAL_TABLET | ORAL | Status: DC | PRN
  Administered 2013-01-20: 1 via ORAL

## 2013-01-20 MED ORDER — KCL IN DEXTROSE-NACL 20-5-0.45 MEQ/L-%-% IV SOLN
INTRAVENOUS | Status: DC
  Filled 2013-01-20 (×3): qty 1000

## 2013-01-20 MED ORDER — DIAZEPAM 5 MG PO TABS
ORAL_TABLET | ORAL | Status: AC
  Filled 2013-01-20: qty 1

## 2013-01-20 MED ORDER — PHENOL 1.4 % MT LIQD: 1.0000 | OROMUCOSAL | Status: DC | PRN

## 2013-01-20 MED ORDER — PROPOFOL 10 MG/ML IV BOLUS
INTRAVENOUS | Status: DC | PRN
  Administered 2013-01-20: 200 mg via INTRAVENOUS

## 2013-01-20 MED ORDER — SIMVASTATIN 10 MG PO TABS
10.0000 mg | ORAL_TABLET | Freq: Every day | ORAL | Status: DC
  Filled 2013-01-20: qty 1

## 2013-01-20 MED ORDER — ZINC SULFATE 220 (50 ZN) MG PO CAPS
220.0000 mg | ORAL_CAPSULE | Freq: Every day | ORAL | Status: DC
  Filled 2013-01-20 (×2): qty 1

## 2013-01-20 MED ORDER — OXYCODONE-ACETAMINOPHEN 5-325 MG PO TABS: 1.0000 | ORAL_TABLET | ORAL | Status: DC | PRN

## 2013-01-20 MED ORDER — HEMOSTATIC AGENTS (NO CHARGE) OPTIME
TOPICAL | Status: DC | PRN
  Administered 2013-01-20: 1 via TOPICAL

## 2013-01-20 MED ORDER — LYCOPENE 10 MG PO CAPS: 10.0000 mg | ORAL_CAPSULE | Freq: Every day | ORAL | Status: DC

## 2013-01-20 MED ORDER — CEFAZOLIN SODIUM 1-5 GM-% IV SOLN
1.0000 g | Freq: Three times a day (TID) | INTRAVENOUS | Status: AC
  Administered 2013-01-20 – 2013-01-21 (×2): 1 g via INTRAVENOUS
  Filled 2013-01-20 (×2): qty 50

## 2013-01-20 MED ORDER — LACTATED RINGERS IV SOLN
INTRAVENOUS | Status: DC
Start: 2013-01-20 — End: 2013-01-20
  Administered 2013-01-20 (×2): via INTRAVENOUS

## 2013-01-20 MED ORDER — FOLIC ACID 1 MG PO TABS
1.0000 mg | ORAL_TABLET | Freq: Every day | ORAL | Status: DC
  Filled 2013-01-20 (×2): qty 1

## 2013-01-20 MED ORDER — OXYCODONE HCL 5 MG PO TABS
ORAL_TABLET | ORAL | Status: AC
  Administered 2013-01-20: 5 mg
  Filled 2013-01-20: qty 1

## 2013-01-20 MED ORDER — ONDANSETRON HCL 4 MG/2ML IJ SOLN: 4.0000 mg | INTRAMUSCULAR | Status: DC | PRN

## 2013-01-20 MED ORDER — SENNA 8.6 MG PO TABS
1.0000 | ORAL_TABLET | Freq: Two times a day (BID) | ORAL | Status: DC
  Filled 2013-01-20 (×2): qty 1

## 2013-01-20 MED ORDER — HYDROMORPHONE HCL PF 1 MG/ML IJ SOLN
0.2500 mg | INTRAMUSCULAR | Status: DC | PRN
  Administered 2013-01-20 (×4): 0.5 mg via INTRAVENOUS

## 2013-01-20 MED ORDER — MIDAZOLAM HCL 5 MG/5ML IJ SOLN
INTRAMUSCULAR | Status: DC | PRN
  Administered 2013-01-20: 2 mg via INTRAVENOUS

## 2013-01-20 MED ORDER — MENTHOL 3 MG MT LOZG: 1.0000 | LOZENGE | OROMUCOSAL | Status: DC | PRN

## 2013-01-20 MED ORDER — OXYCODONE HCL 5 MG PO TABS: 5.0000 mg | ORAL_TABLET | Freq: Once | ORAL | Status: DC | PRN

## 2013-01-20 MED ORDER — SELENIUM 50 MCG PO TABS: 50.0000 ug | ORAL_TABLET | Freq: Every day | ORAL | Status: DC

## 2013-01-20 MED ORDER — MORPHINE SULFATE 2 MG/ML IJ SOLN
1.0000 mg | INTRAMUSCULAR | Status: DC | PRN
  Administered 2013-01-21: 4 mg via INTRAVENOUS
  Filled 2013-01-20: qty 2

## 2013-01-20 MED ORDER — BETA CAROTENE 15 MG PO CAPS: 15.0000 mg | ORAL_CAPSULE | Freq: Every day | ORAL | Status: DC

## 2013-01-20 MED ORDER — OMEGA-3-ACID ETHYL ESTERS 1 G PO CAPS
1.0000 g | ORAL_CAPSULE | Freq: Every day | ORAL | Status: DC
  Filled 2013-01-20 (×2): qty 1

## 2013-01-20 MED ORDER — ALUM & MAG HYDROXIDE-SIMETH 200-200-20 MG/5ML PO SUSP: 30.0000 mL | Freq: Four times a day (QID) | ORAL | Status: DC | PRN

## 2013-01-20 MED ORDER — PANTOPRAZOLE SODIUM 40 MG IV SOLR
40.0000 mg | Freq: Every day | INTRAVENOUS | Status: DC
  Administered 2013-01-20: 40 mg via INTRAVENOUS
  Filled 2013-01-20 (×2): qty 40

## 2013-01-20 MED ORDER — OXYCODONE-ACETAMINOPHEN 5-325 MG PO TABS
1.0000 | ORAL_TABLET | ORAL | Status: DC | PRN
  Administered 2013-01-20 – 2013-01-21 (×2): 2 via ORAL
  Filled 2013-01-20 (×2): qty 2

## 2013-01-20 MED ORDER — DEXAMETHASONE SODIUM PHOSPHATE 10 MG/ML IJ SOLN
INTRAMUSCULAR | Status: AC
  Administered 2013-01-20: 10 mg via INTRAVENOUS
  Filled 2013-01-20: qty 1

## 2013-01-20 MED ORDER — NEOSTIGMINE METHYLSULFATE 1 MG/ML IJ SOLN
INTRAMUSCULAR | Status: DC | PRN
  Administered 2013-01-20: 5 mg via INTRAVENOUS

## 2013-01-20 MED ORDER — OXYCODONE HCL 5 MG PO TABS: 5.0000 mg | ORAL_TABLET | ORAL | Status: DC | PRN

## 2013-01-20 MED ORDER — ACETAMINOPHEN 650 MG RE SUPP: 650.0000 mg | RECTAL | Status: DC | PRN

## 2013-01-20 MED ORDER — PHENYLEPHRINE HCL 10 MG/ML IJ SOLN
INTRAMUSCULAR | Status: DC | PRN
  Administered 2013-01-20: 40 ug via INTRAVENOUS
  Administered 2013-01-20 (×3): 80 ug via INTRAVENOUS
  Administered 2013-01-20: 120 ug via INTRAVENOUS

## 2013-01-20 MED ORDER — SODIUM CHLORIDE 0.9 % IJ SOLN: 3.0000 mL | Freq: Two times a day (BID) | INTRAMUSCULAR | Status: DC

## 2013-01-20 MED ORDER — 0.9 % SODIUM CHLORIDE (POUR BTL) OPTIME
TOPICAL | Status: DC | PRN
  Administered 2013-01-20: 1000 mL

## 2013-01-20 MED ORDER — GLYCOPYRROLATE 0.2 MG/ML IJ SOLN
INTRAMUSCULAR | Status: DC | PRN
  Administered 2013-01-20: .8 mg via INTRAVENOUS

## 2013-01-20 MED ORDER — POLYETHYLENE GLYCOL 3350 17 G PO PACK
17.0000 g | PACK | Freq: Every day | ORAL | Status: DC | PRN
  Filled 2013-01-20: qty 1

## 2013-01-20 MED ORDER — COD LIVER OIL PO CAPS: 1.0000 | ORAL_CAPSULE | Freq: Every day | ORAL | Status: DC

## 2013-01-20 MED ORDER — DOCUSATE SODIUM 100 MG PO CAPS: 100.0000 mg | ORAL_CAPSULE | Freq: Two times a day (BID) | ORAL | Status: DC

## 2013-01-20 MED ORDER — ZINC GLUCONATE 50 MG PO TABS: 50.0000 mg | ORAL_TABLET | Freq: Every day | ORAL | Status: DC

## 2013-01-20 MED ORDER — HYDROCODONE-ACETAMINOPHEN 5-325 MG PO TABS
ORAL_TABLET | ORAL | Status: AC
  Filled 2013-01-20: qty 1

## 2013-01-20 MED ORDER — THROMBIN 5000 UNITS EX SOLR
CUTANEOUS | Status: DC | PRN
  Administered 2013-01-20 (×2): 5000 [IU] via TOPICAL

## 2013-01-20 MED ORDER — FLEET ENEMA 7-19 GM/118ML RE ENEM
1.0000 | ENEMA | Freq: Once | RECTAL | Status: AC | PRN
  Filled 2013-01-20: qty 1

## 2013-01-20 MED ORDER — CYANOCOBALAMIN 500 MCG PO TABS
500.0000 ug | ORAL_TABLET | Freq: Every day | ORAL | Status: DC
  Filled 2013-01-20 (×2): qty 1

## 2013-01-20 MED ORDER — SODIUM CHLORIDE 0.9 % IJ SOLN: 3.0000 mL | INTRAMUSCULAR | Status: DC | PRN

## 2013-01-20 MED ORDER — VITAMIN B-6 100 MG PO TABS
500.0000 mg | ORAL_TABLET | Freq: Every day | ORAL | Status: DC
  Filled 2013-01-20 (×2): qty 5

## 2013-01-20 MED ORDER — ROCURONIUM BROMIDE 100 MG/10ML IV SOLN
INTRAVENOUS | Status: DC | PRN
  Administered 2013-01-20: 50 mg via INTRAVENOUS
  Administered 2013-01-20 (×3): 10 mg via INTRAVENOUS

## 2013-01-20 MED ORDER — ACETAMINOPHEN 325 MG PO TABS: 650.0000 mg | ORAL_TABLET | ORAL | Status: DC | PRN

## 2013-01-20 MED ORDER — ZOLPIDEM TARTRATE 5 MG PO TABS: 5.0000 mg | ORAL_TABLET | Freq: Every evening | ORAL | Status: DC | PRN

## 2013-01-20 MED ORDER — OXYCODONE HCL 5 MG/5ML PO SOLN: 5.0000 mg | Freq: Once | ORAL | Status: DC | PRN

## 2013-01-20 MED ORDER — FLAXSEED OIL 1000 MG PO CAPS: 1000.0000 mg | ORAL_CAPSULE | Freq: Every day | ORAL | Status: DC

## 2013-01-20 MED ORDER — FENTANYL CITRATE 0.05 MG/ML IJ SOLN
INTRAMUSCULAR | Status: DC | PRN
  Administered 2013-01-20: 100 ug via INTRAVENOUS

## 2013-01-20 MED ORDER — BISACODYL 10 MG RE SUPP: 10.0000 mg | Freq: Every day | RECTAL | Status: DC | PRN

## 2013-01-20 MED ORDER — PROMETHAZINE HCL 25 MG/ML IJ SOLN: 6.2500 mg | INTRAMUSCULAR | Status: DC | PRN

## 2013-01-20 MED ORDER — ONDANSETRON HCL 4 MG/2ML IJ SOLN
INTRAMUSCULAR | Status: DC | PRN
  Administered 2013-01-20: 4 mg via INTRAVENOUS

## 2013-01-20 SURGICAL SUPPLY — 74 items
ADH SKN CLS APL DERMABOND .7 (GAUZE/BANDAGES/DRESSINGS) ×1
ADH SKN CLS LQ APL DERMABOND (GAUZE/BANDAGES/DRESSINGS) ×1
APL SKNCLS STERI-STRIP NONHPOA (GAUZE/BANDAGES/DRESSINGS)
BAG DECANTER FOR FLEXI CONT (MISCELLANEOUS) ×2 IMPLANT
BANDAGE GAUZE ELAST BULKY 4 IN (GAUZE/BANDAGES/DRESSINGS) IMPLANT
BENZOIN TINCTURE PRP APPL 2/3 (GAUZE/BANDAGES/DRESSINGS) IMPLANT
BIT DRILL 14MM (INSTRUMENTS) IMPLANT
BIT DRILL NEURO 2X3.1 SFT TUCH (MISCELLANEOUS) ×1 IMPLANT
BLADE ULTRA TIP 2M (BLADE) IMPLANT
BUR BARREL STRAIGHT FLUTE 4.0 (BURR) ×2 IMPLANT
CANISTER SUCT 3000ML (MISCELLANEOUS) ×2 IMPLANT
CONT SPEC 4OZ CLIKSEAL STRL BL (MISCELLANEOUS) ×2 IMPLANT
COVER MAYO STAND STRL (DRAPES) ×2 IMPLANT
DERMABOND ADHESIVE PROPEN (GAUZE/BANDAGES/DRESSINGS) ×1
DERMABOND ADVANCED (GAUZE/BANDAGES/DRESSINGS) ×1
DERMABOND ADVANCED .7 DNX12 (GAUZE/BANDAGES/DRESSINGS) ×1 IMPLANT
DERMABOND ADVANCED .7 DNX6 (GAUZE/BANDAGES/DRESSINGS) IMPLANT
DRAPE LAPAROTOMY 100X72 PEDS (DRAPES) ×2 IMPLANT
DRAPE MICROSCOPE LEICA (MISCELLANEOUS) ×2 IMPLANT
DRAPE POUCH INSTRU U-SHP 10X18 (DRAPES) ×2 IMPLANT
DRAPE PROXIMA HALF (DRAPES) IMPLANT
DRESSING TELFA 8X3 (GAUZE/BANDAGES/DRESSINGS) IMPLANT
DRILL 14MM (INSTRUMENTS) ×2
DRILL NEURO 2X3.1 SOFT TOUCH (MISCELLANEOUS) ×2
DURAPREP 6ML APPLICATOR 50/CS (WOUND CARE) ×2 IMPLANT
ELECT COATED BLADE 2.86 ST (ELECTRODE) ×2 IMPLANT
ELECT REM PT RETURN 9FT ADLT (ELECTROSURGICAL) ×2
ELECTRODE REM PT RTRN 9FT ADLT (ELECTROSURGICAL) ×1 IMPLANT
GAUZE SPONGE 4X4 16PLY XRAY LF (GAUZE/BANDAGES/DRESSINGS) IMPLANT
GLOVE BIO SURGEON STRL SZ8 (GLOVE) ×2 IMPLANT
GLOVE BIOGEL PI IND STRL 7.5 (GLOVE) IMPLANT
GLOVE BIOGEL PI IND STRL 8 (GLOVE) ×1 IMPLANT
GLOVE BIOGEL PI IND STRL 8.5 (GLOVE) ×1 IMPLANT
GLOVE BIOGEL PI INDICATOR 7.5 (GLOVE) ×1
GLOVE BIOGEL PI INDICATOR 8 (GLOVE) ×1
GLOVE BIOGEL PI INDICATOR 8.5 (GLOVE) ×1
GLOVE ECLIPSE 7.5 STRL STRAW (GLOVE) ×1 IMPLANT
GLOVE ECLIPSE 8.0 STRL XLNG CF (GLOVE) ×2 IMPLANT
GLOVE EXAM NITRILE LRG STRL (GLOVE) IMPLANT
GLOVE EXAM NITRILE MD LF STRL (GLOVE) IMPLANT
GLOVE EXAM NITRILE XL STR (GLOVE) IMPLANT
GLOVE EXAM NITRILE XS STR PU (GLOVE) IMPLANT
GOWN BRE IMP SLV AUR LG STRL (GOWN DISPOSABLE) IMPLANT
GOWN BRE IMP SLV AUR XL STRL (GOWN DISPOSABLE) ×2 IMPLANT
GOWN STRL REIN 2XL LVL4 (GOWN DISPOSABLE) ×1 IMPLANT
HEAD HALTER (SOFTGOODS) ×2 IMPLANT
KIT BASIN OR (CUSTOM PROCEDURE TRAY) ×2 IMPLANT
KIT ROOM TURNOVER OR (KITS) ×2 IMPLANT
NDL HYPO 18GX1.5 BLUNT FILL (NEEDLE) IMPLANT
NDL HYPO 25X1 1.5 SAFETY (NEEDLE) ×1 IMPLANT
NDL SPNL 22GX3.5 QUINCKE BK (NEEDLE) ×1 IMPLANT
NEEDLE HYPO 18GX1.5 BLUNT FILL (NEEDLE) IMPLANT
NEEDLE HYPO 25X1 1.5 SAFETY (NEEDLE) ×2 IMPLANT
NEEDLE SPNL 22GX3.5 QUINCKE BK (NEEDLE) ×2 IMPLANT
NS IRRIG 1000ML POUR BTL (IV SOLUTION) ×2 IMPLANT
PACK LAMINECTOMY NEURO (CUSTOM PROCEDURE TRAY) ×2 IMPLANT
PAD ARMBOARD 7.5X6 YLW CONV (MISCELLANEOUS) ×6 IMPLANT
PIN DISTRACTION 14MM (PIN) ×4 IMPLANT
PLATE 14MM (Plate) ×1 IMPLANT
RUBBERBAND STERILE (MISCELLANEOUS) ×4 IMPLANT
SCREW 14MM (Screw) ×4 IMPLANT
SPACER ASSEM CERV LORD 8M (Spacer) ×1 IMPLANT
SPONGE GAUZE 4X4 12PLY (GAUZE/BANDAGES/DRESSINGS) IMPLANT
SPONGE INTESTINAL PEANUT (DISPOSABLE) ×2 IMPLANT
SPONGE SURGIFOAM ABS GEL SZ50 (HEMOSTASIS) ×2 IMPLANT
STAPLER SKIN PROX WIDE 3.9 (STAPLE) IMPLANT
STRIP CLOSURE SKIN 1/2X4 (GAUZE/BANDAGES/DRESSINGS) IMPLANT
SUT VIC AB 3-0 SH 8-18 (SUTURE) ×5 IMPLANT
SYR 20ML ECCENTRIC (SYRINGE) ×2 IMPLANT
SYR 3ML LL SCALE MARK (SYRINGE) IMPLANT
TOWEL OR 17X24 6PK STRL BLUE (TOWEL DISPOSABLE) ×2 IMPLANT
TOWEL OR 17X26 10 PK STRL BLUE (TOWEL DISPOSABLE) ×2 IMPLANT
TRAP SPECIMEN MUCOUS 40CC (MISCELLANEOUS) ×2 IMPLANT
WATER STERILE IRR 1000ML POUR (IV SOLUTION) ×2 IMPLANT

## 2013-01-20 NOTE — Anesthesia Postprocedure Evaluation (Signed)
  Anesthesia Post-op Note  Patient: Mitchell Ross  Procedure(s) Performed: Procedure(s) with comments: ANTERIOR CERVICAL DECOMPRESSION/DISCECTOMY FUSION 1 LEVEL (N/A) - C3-4 Anterior cervical decompression/diskectomy/fusion  Patient Location: PACU  Anesthesia Type:General  Level of Consciousness: awake, alert , oriented and patient cooperative  Airway and Oxygen Therapy: Patient Spontanous Breathing and Patient connected to nasal cannula oxygen  Post-op Pain: mild  Post-op Assessment: Post-op Vital signs reviewed, Patient's Cardiovascular Status Stable, Respiratory Function Stable, Patent Airway, No signs of Nausea or vomiting and Pain level controlled  Post-op Vital Signs: Reviewed and stable  Complications: No apparent anesthesia complications

## 2013-01-20 NOTE — Progress Notes (Signed)
Awake, alert, conversant.  Full strength both upper and lower extremities.  Doing well. 

## 2013-01-20 NOTE — Interval H&P Note (Signed)
History and Physical Interval Note:  01/20/2013 12:23 PM  Mitchell Ross  has presented today for surgery, with the diagnosis of Cervical spondylosis with myelopathy, Cervical stenosis  The various methods of treatment have been discussed with the patient and family. After consideration of risks, benefits and other options for treatment, the patient has consented to  Procedure(s) with comments: ANTERIOR CERVICAL DECOMPRESSION/DISCECTOMY FUSION 1 LEVEL (N/A) - C3-4 Anterior cervical decompression/diskectomy/fusion as a surgical intervention .  The patient's history has been reviewed, patient examined, no change in status, stable for surgery.  I have reviewed the patient's chart and labs.  Questions were answered to the patient's satisfaction.     [name] D

## 2013-01-20 NOTE — Brief Op Note (Signed)
01/20/2013  5:11 PM  PATIENT:  Mitchell Ross  57 y.o. male  PRE-OPERATIVE DIAGNOSIS:  Cervical spondylosis with myelopathy, Cervical stenosis, herniated cervical disc with myelopathy C 34  POST-OPERATIVE DIAGNOSIS:  Cervical spondylosis with myelopathy, Cervical stenosis, herniated cervical disc with myelopathy C 34  PROCEDURE:  Procedure(s) with comments: ANTERIOR CERVICAL DECOMPRESSION/DISCECTOMY FUSION 1 LEVEL (N/A) - C3-4 Anterior cervical decompression/diskectomy/fusion with allograft, autograft, plate  SURGEON:  Surgeon(s) and Role:    * Jaycie Kregel, MD - Primary  PHYSICIAN ASSISTANT: Hirsch, MD  ASSISTANTS: Poteat, RN   ANESTHESIA:   general  EBL:  Total I/O In: 1000 [I.V.:1000] Out: -   BLOOD ADMINISTERED:none  DRAINS: none   LOCAL MEDICATIONS USED:  LIDOCAINE   SPECIMEN:  No Specimen  DISPOSITION OF SPECIMEN:  N/A  COUNTS:  YES  TOURNIQUET:  * No tourniquets in log *  DICTATION: Patient is 57 year old male with severe cord compression of C3 on 4 with stenosis, myelopathy, cord compression, HNP.  It was elected to take him to surgery for anterior cervical decompression and fusion C 34 level.  PROCEDURE: Patient was brought to operating room and following the smooth and uncomplicated induction of general endotracheal anesthesia his head was placed on a horseshoe head holder he was placed in 5 pounds of Holter traction and his anterior neck was prepped and draped in usual sterile fashion. An incision was made on the left side of midline after infiltrating the skin and subcutaneous tissues with local lidocaine. The platysmal layer was incised and subplatysmal dissection was performed exposing the anterior border sternocleidomastoid muscle. Using blunt dissection the carotid sheath was kept lateral and trachea and esophagus kept medial exposing the anterior cervical spine. A bent spinal needle was placed it was felt to be the C 34 level and this was confirmed on  intraoperative x-ray. Longus coli muscles were taken down from the anterior cervical spine using electrocautery and key elevator and self-retaining retractor was placed exposing the C 34 level. The interspace was incised and a thorough discectomy was performed. Distraction pins were placed. Uncinate spurs and central spondylitic ridges were drilled down with a high-speed drill. The spinal cord dura and both C4 nerve roots were widely decompressed. Hemostasis was assured. After trial sizing an 8 mm lordotic allograft bone wedge was selected and packed with local autograft. This was tamped into position and countersunk appropriately. Distraction weight was removed. A 14 mm trestle luxe anterior cervical plate was affixed to the cervical spine with 14 mm variable-angle screws 2 at C3, 2 at C4. All screws were well-positioned and locking mechanisms were engaged. A final X ray was obtained which showed well positioned graft and anterior plate without complicating features. Soft tissues were inspected and found to be in good repair. The wound was irrigated. The platysma layer was closed with 3-0 Vicryl stitches and the skin was reapproximated with 3-0 Vicryl subcuticular stitches. The wound was dressed with Dermabond. Counts were correct at the end of the case. Patient was extubated and taken to recovery in stable and satisfactory condition.   PLAN OF CARE: Admit for overnight observation  PATIENT DISPOSITION:  PACU - hemodynamically stable.   Delay start of Pharmacological VTE agent (>24hrs) due to surgical blood loss or risk of bleeding: yes  

## 2013-01-20 NOTE — Transfer of Care (Signed)
Immediate Anesthesia Transfer of Care Note  Patient: Mitchell Ross  Procedure(s) Performed: Procedure(s) with comments: ANTERIOR CERVICAL DECOMPRESSION/DISCECTOMY FUSION 1 LEVEL (N/A) - C3-4 Anterior cervical decompression/diskectomy/fusion  Patient Location: PACU  Anesthesia Type:General  Level of Consciousness: awake, alert  and oriented  Airway & Oxygen Therapy: Patient Spontanous Breathing and Patient connected to nasal cannula oxygen  Post-op Assessment: Report given to PACU RN and Patient moving all extremities X 4  Post vital signs: Reviewed and stable  Complications: No apparent anesthesia complications

## 2013-01-20 NOTE — Anesthesia Procedure Notes (Signed)
Procedure Name: Intubation Date/Time: 01/20/2013 3:38 PM Performed by: Jefm Miles E Pre-anesthesia Checklist: Patient identified, Emergency Drugs available, Suction available, Patient being monitored and Timeout performed Patient Re-evaluated:Patient Re-evaluated prior to inductionOxygen Delivery Method: Circle system utilized Preoxygenation: Pre-oxygenation with 100% oxygen Intubation Type: IV induction Ventilation: Mask ventilation without difficulty and Oral airway inserted - appropriate to patient size Laryngoscope Size: Mac Grade View: Grade I Tube type: Oral Tube size: 7.5 mm Number of attempts: 2 Airway Equipment and Method: Stylet and Video-laryngoscopy Placement Confirmation: ETT inserted through vocal cords under direct vision,  positive ETCO2 and breath sounds checked- equal and bilateral Secured at: 24 cm Tube secured with: Tape Dental Injury: Teeth and Oropharynx as per pre-operative assessment  Comments: DL x1 with MAC 3- Grade IV view, DL x 1 with Glidescope with Grade I view. VSS

## 2013-01-20 NOTE — H&P (Signed)
[address] [place] [address], [place] [id number] Phone: [phone]   Patient ID:   [id number] Patient: Mitchell Ross  Date of Birth: 11-Oct-1955 Visit Type: Office Visit   Date: 01/12/2013 12:30 PM Provider: Danae Orleans. Venetia Maxon    This 57 year old male presents for Weakness, numbness, neck pain and pain.  HISTORY OF PRESENT ILLNESS: 1.  Weakness  The symptoms began on 05/27/2012. The symptoms are reported as being moderate. The symptoms occur constantly. Aggravating factors include walking, standing and bending. Relieving factors include medication. Patient is new to practice complains earlier this year he fell at work while he was trying to stop a machine and injured his right side. Patient also notes when he fell he suffered a concussion. 2.  numbness  3.  neck pain   The location is Posterior. Patient states MRI was performed 4.  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 Dx Concussion - s/s improved & he returned to work two weeks after his injury.  his right sided weaknes persisted prompting MRI. Other s/s include tingling/pain tips of all fingers and toes.   Hx: Hep C, left rotator cuff tear  MRI & x-ray on [place]  Patient complains of pain in his neck also into his fingertips and into his toes.  He notes numbness into his toes and fingers.  He notes weakness on the right side of his body.  He says that he tripped over a tire hoist and fell onto a concrete floor and developed this pain on 27 May 2012.  This has not been acknowledged as a work injury but the patient is very adamant that this injury did occur at work.  He has been to a chiropractor and did not get any help with this including with massage therapy.  Patient previously saw me 10 years ago for some neck issues with right arm signs and symptoms and he did well with home traction  with resolution of these complaints.     PAST MEDICAL/SURGICAL HISTORY  (Detailed) The patient did not report any relevant medical, surgical or psychiatric history.       PAST MEDICAL HISTORY, SURGICAL HISTORY, FAMILY HISTORY, SOCIAL HISTORY AND REVIEW OF SYSTEMS I have reviewed the patient's past medical, surgical, family and social history as well as the comprehensive review of systems as included on the [facility] history form dated 01/12/2013, which I have signed.  Family History  (Detailed) Patient reports there is no relevant family history.   SOCIAL HISTORY  (Detailed) Tobacco use reviewed. Preferred language is Unknown.   Smoking status: Light tobacco smoker.  SMOKING STATUS Use Status Type Smoking Status Usage Per Day Years Used Total Pack Years  yes Cigarette Light tobacco smoker 6 Cigarettes 39 11.7   TOBACCO CESSATION INFORMATION Date Counseled By Order Status Description Code Tobacco Cessation Information  01/12/2013      Smoking cessation education    HOME ENVIRONMENT/SAFETY  The Patient has fallen 1 times in the last year.  The fall(s) resulted in injury.  Details: Patient fell at work injuring right side.       MEDICATIONS(added, continued or stopped this visit):   Medication Dose Prescribed Else Ind Started Stopped  aspirin 81 mg tablet,delayed release 81 mg Y    beta carotene UNKNOWN Y    cod liver oil capsule  Y    Fish Oil UNKNOWN Y    flaxseed oil UNKNOWN Y    folic acid 1 mg tablet 1 mg Y    lycopene UNKNOWN Y    Motrin IB 200 mg tablet 200 mg Y    Percocet 10 mg-325 mg tablet 10 mg-325 mg N 01/12/2013   Pravachol 20 mg tablet 20 mg Y    saw palmetto UNKNOWN Y       ALLERGIES:  Ingredient Reaction Medication Name Comment  NO KNOWN ALLERGIES     No known allergies.  REVIEW OF SYSTEMS: System Neg/Pos Details  Constitutional Negative Chills, fatigue, fever, malaise, night sweats, weight gain and weight  loss.  ENMT Negative Ear drainage, hearing loss, nasal drainage, otalgia, sinus pressure and sore throat.  Eyes Negative Eye discharge, eye pain and vision changes.  Respiratory Negative Chronic cough, cough, dyspnea, known TB exposure and wheezing.  Cardio Negative Chest pain, claudication, edema and irregular heartbeat/palpitations.  GI Negative Abdominal pain, blood in stool, change in stool pattern, constipation, decreased appetite, diarrhea, heartburn, nausea and vomiting.  GU Negative Dribbling, dysuria, erectile dysfunction, hematuria, polyuria, slow stream, urinary frequency, urinary incontinence and urinary retention.  Endocrine Negative Cold intolerance, heat intolerance, polydipsia and polyphagia.  Neuro Positive Extremity weakness, Numbness in extremities.  Neuro Negative Dizziness, gait disturbance, headache, memory impairment, seizures and tremors.  Psych Negative Anxiety, depression and insomnia.  Integumentary Negative Brittle hair, brittle nails, change in shape/size of mole(s), hair loss, hirsutism, hives, pruritus, rash and skin lesion.  MS Positive Neck pain.  MS Negative Back pain, joint pain, joint swelling and muscle weakness.  Hema/Lymph Negative Easy bleeding, easy bruising and lymphadenopathy.  Allergic/Immuno Negative Contact allergy, environmental allergies, food allergies and seasonal allergies.  Reproductive Negative Penile discharge and sexual dysfunction.    Vitals Date Temp F BP Pulse Ht In Wt Lb BMI BSA Pain Score  01/12/2013  143/89 68 76 209.4 25.49  6/10     PHYSICAL EXAM General Level of Distress: no acute distress Overall Appearance: normal  Head and Face  Right Left  Fundoscopic Exam:  normal normal    Cardiovascular Cardiac: regular rate and rhythm without murmur  Right Left  Carotid Pulses: normal normal  Respiratory Lungs: clear to auscultation  Neurological Orientation: normal Recent and Remote Memory: normal Attention Span and  Concentration:   normal Language: normal Fund of Knowledge: normal  Right Left Sensation: normal normal Upper Extremity Coordination: normal normal  Lower Extremity Coordination: normal normal  Musculoskeletal Gait and Station: normal  Right Left Upper Extremity Muscle Strength: normal normal Lower Extremity Muscle Strength: normal normal Upper Extremity Muscle Tone:  normal normal Lower Extremity Muscle Tone: normal normal  Motor Strength Upper and lower extremity motor strength was tested in the clinically pertinent muscles .Any abnormal findings will be noted below..   Right Left Grip: 4-/5 4-/5 Finger Extensor: 4-/5 4-/5  Deep Tendon Reflexes  Right Left Biceps: increase increase Triceps: increase increase Brachiloradialis: increase increase Patellar: increase increase Achilles: increase increase  Cranial Nerves II. Optic Nerve/Visual Fields: normal III. Oculomotor: normal IV. Trochlear: normal V. Trigeminal: normal VI. Abducens: normal VII. Facial: normal VIII. Acoustic/Vestibular: normal IX. Glossopharyngeal: normal X. Vagus: normal XI. Spinal Accessory: normal XII. Hypoglossal: normal  Motor and other Tests Lhermittes: positive Rhomberg: negative Pronator drift: absent     Right Left Hoffman's: normal normal Clonus: normal normal Babinski: normal normal   Additional Findings:  Patient appears to have a cervicothoracic sensory level with decrease sensation  below this level.    DIAGNOSTIC RESULTS Diagnostic report text  CLINICAL DATA: Neck pain with bilateral weakness and numbness in the hands after a fall with head injury in March 2014.  EXAM: MRI CERVICAL SPINE WITHOUT CONTRAST  TECHNIQUE: Multiplanar, multisequence MR imaging was performed. No intravenous contrast was administered.  COMPARISON: Report of prior cervical MRI dated 07/05/2001  FINDINGS: The visualized intracranial contents are normal. Paraspinal soft tissues are  normal.  C1-2: Normal.  C2-3 slight uncinate spurs of the right and left without neural impingement. Slight bilateral facet arthritis, left greater than right.  C3-4: Prominent broad-based soft disc protrusion slightly asymmetric to the right with marked compression of the spinal cord with myelopathy with the right side of the cord. The cord is compressed to approximately 3.8 mm AP dimension. Slight left facet arthritis. Bilateral foraminal encroachment by the disc protrusion and osteophytes.  C4-5: 3 mm spondylolisthesis is due to severe left and mild right facet arthritis. Central bulge of the disc with bilateral foraminal stenosis. No spinal cord compression.  C5-6: Small broad-based disc protrusion which narrows the left lateral recess with left foraminal stenosis. Slight left facet arthritis.  C6-7: Small broad-based disc bulge asymmetric into the right lateral recess and right neural foramen with slight right foraminal stenosis.  C7-T1: Normal.  IMPRESSION: 1. Spinal cord compression with myelopathy at C3-4 due to a broad-based disc protrusion. Bilateral foraminal and lateral recess encroachment. 2. Bilateral foraminal stenosis at C4-5, left greater than right. 3. Left foraminal stenosis at C5-6. 4. Right foraminal stenosis at C6-7.   Electronically Signed By: Geanie Cooley M.D. On: 12/24/2012 13:56 Cervical radiographs demonstrate multilevel spondylosis.  He does have canal stenosis at the C3-C4 level.    IMPRESSION Patient has a significant cervical myelopathy.  He has marked cord compression at C3-C4.  He has multilevel spondylosis at other levels of the cervical spine but these do not appear to be symptomatic.  He is weaker on his right side with hyperreflexia.  I advised him to come out of work.  I have also advised him to undergo surgery.  This will consist of an anterior cervical decompression and fusion at the C34 level.  Completed Orders (this  encounter) Order Details Reason Side Interpretation Result Initial Treatment Date Region  Exercise promotion: strength training         Dietary management education, guidance, and counseling         Cervial Spine- AP/Lat/Obls/Odontoid/Flex/Ex      01/12/2013 All Levels to All Levels   Assessment/Plan # Detail Type Description   1. Assessment Overweight (278.02).   Plan Orders  Today's instructions / counseling include(s) Dietary management education, guidance, and counseling. Exercise promotion: strength training.       2. Assessment Cervical spinal stenosis (723.0).       3. Assessment Cervical spondylosis w/ myelopathy (721.1).       4. Assessment Central cord syndrome (952.9).       5. Assessment Hand weakness (728.87).        Pain Assessment/Treatment Pain Scale: 6/10. Method: Numeric Pain Intensity Scale. Pain Assessment/Treatment follow-up plan of care: New patient discuss treatment with Dr.Brenetta Penny..  Fall Risk Plan The Patient has fallen 1 times in the last year. The fall(s) resulted in injury. Details: Patient fell at work injuring right side.  Anterior cervical decompression and fusion at C3-C4.  Risks and benefits were discussed with the patient and he wishes to proceed.  I do believe that this is related to his  work injury and should be covered by workers compensation but I have advised him to go ahead with surgery rather than wait for and he'll to occur of the previous denial.  patient was also advised of the importance of stopping smoking.  He was fitted for a Vista cervical collar.  Orders: Diagnostic Procedures: Assessment Procedure   Cervial Spine- AP/Lat/Obls/Odontoid/Flex/Ex  721.1 ACDF - C3-C4  Instruction(s)/Education: Assessment Instruction  278.02 Dietary management education, guidance, and counseling  278.02 Exercise promotion: strength training    MEDICATION PRESCRIBED TODAY    Rx Quantity Refills  PERCOCET 10 mg-325 mg  60 0             Provider:  Danae Orleans. Venetia Maxon  01/16/2013 10:28 AM Dictation edited by: Danae Orleans. Venetia Maxon    CC Providers: Shon Millet ----------------------------------------------------------------------------------------------------------------------------------------------------------------------         Electronically signed by Danae Orleans. Venetia Maxon on 01/16/2013 10:28 AM

## 2013-01-20 NOTE — Preoperative (Signed)
Beta Blockers   Reason not to administer Beta Blockers:Not Applicable 

## 2013-01-20 NOTE — Op Note (Signed)
01/20/2013  5:11 PM  PATIENT:  Mitchell Ross  57 y.o. male  PRE-OPERATIVE DIAGNOSIS:  Cervical spondylosis with myelopathy, Cervical stenosis, herniated cervical disc with myelopathy C 34  POST-OPERATIVE DIAGNOSIS:  Cervical spondylosis with myelopathy, Cervical stenosis, herniated cervical disc with myelopathy C 34  PROCEDURE:  Procedure(s) with comments: ANTERIOR CERVICAL DECOMPRESSION/DISCECTOMY FUSION 1 LEVEL (N/A) - C3-4 Anterior cervical decompression/diskectomy/fusion with allograft, autograft, plate  SURGEON:  Surgeon(s) and Role:    * Maeola Harman, MD - Primary  PHYSICIAN ASSISTANT: Phoebe Perch, MD  ASSISTANTS: [place], RN   ANESTHESIA:   general  EBL:  Total I/O In: 1000 [I.V.:1000] Out: -   BLOOD ADMINISTERED:none  DRAINS: none   LOCAL MEDICATIONS USED:  LIDOCAINE   SPECIMEN:  No Specimen  DISPOSITION OF SPECIMEN:  N/A  COUNTS:  YES  TOURNIQUET:  * No tourniquets in log *  DICTATION: Patient is 57 year old male with severe cord compression of C3 on 4 with stenosis, myelopathy, cord compression, HNP.  It was elected to take him to surgery for anterior cervical decompression and fusion C 34 level.  PROCEDURE: Patient was brought to operating room and following the smooth and uncomplicated induction of general endotracheal anesthesia his head was placed on a horseshoe head holder he was placed in 5 pounds of Holter traction and his anterior neck was prepped and draped in usual sterile fashion. An incision was made on the left side of midline after infiltrating the skin and subcutaneous tissues with local lidocaine. The platysmal layer was incised and subplatysmal dissection was performed exposing the anterior border sternocleidomastoid muscle. Using blunt dissection the carotid sheath was kept lateral and trachea and esophagus kept medial exposing the anterior cervical spine. A bent spinal needle was placed it was felt to be the C 34 level and this was confirmed on  intraoperative x-ray. Longus coli muscles were taken down from the anterior cervical spine using electrocautery and key elevator and self-retaining retractor was placed exposing the C 34 level. The interspace was incised and a thorough discectomy was performed. Distraction pins were placed. Uncinate spurs and central spondylitic ridges were drilled down with a high-speed drill. The spinal cord dura and both C4 nerve roots were widely decompressed. Hemostasis was assured. After trial sizing an 8 mm lordotic allograft bone wedge was selected and packed with local autograft. This was tamped into position and countersunk appropriately. Distraction weight was removed. A 14 mm trestle luxe anterior cervical plate was affixed to the cervical spine with 14 mm variable-angle screws 2 at C3, 2 at C4. All screws were well-positioned and locking mechanisms were engaged. A final X ray was obtained which showed well positioned graft and anterior plate without complicating features. Soft tissues were inspected and found to be in good repair. The wound was irrigated. The platysma layer was closed with 3-0 Vicryl stitches and the skin was reapproximated with 3-0 Vicryl subcuticular stitches. The wound was dressed with Dermabond. Counts were correct at the end of the case. Patient was extubated and taken to recovery in stable and satisfactory condition.   PLAN OF CARE: Admit for overnight observation  PATIENT DISPOSITION:  PACU - hemodynamically stable.   Delay start of Pharmacological VTE agent (>24hrs) due to surgical blood loss or risk of bleeding: yes

## 2013-01-20 NOTE — Anesthesia Preprocedure Evaluation (Addendum)
Anesthesia Evaluation  Patient identified by MRN, date of birth, ID band Patient awake    Reviewed: Allergy & Precautions, H&P , NPO status , Patient's Chart, lab work & pertinent test results  Airway Mallampati: I  Neck ROM: Full    Dental  (+) Teeth Intact and Dental Advisory Given   Pulmonary  breath sounds clear to auscultation        Cardiovascular hypertension, Rhythm:Regular Rate:Normal     Neuro/Psych  Neuromuscular disease    GI/Hepatic (+) Hepatitis -  Endo/Other    Renal/GU      Musculoskeletal   Abdominal   Peds  Hematology   Anesthesia Other Findings   Reproductive/Obstetrics                          Anesthesia Physical Anesthesia Plan  ASA: II  Anesthesia Plan: General   Post-op Pain Management:    Induction: Intravenous  Airway Management Planned: Oral ETT  Additional Equipment:   Intra-op Plan:   Post-operative Plan: Extubation in OR  Informed Consent: I have reviewed the patients History and Physical, chart, labs and discussed the procedure including the risks, benefits and alternatives for the proposed anesthesia with the patient or authorized representative who has indicated his/her understanding and acceptance.   Dental advisory given  Plan Discussed with: CRNA and Surgeon  Anesthesia Plan Comments:         Anesthesia Quick Evaluation

## 2013-01-21 ENCOUNTER — Encounter (HOSPITAL_COMMUNITY): Payer: Self-pay | Admitting: Neurosurgery

## 2013-01-21 DIAGNOSIS — M4712 Other spondylosis with myelopathy, cervical region: Secondary | ICD-10-CM | POA: Diagnosis not present

## 2013-01-21 MED ORDER — OXYCODONE-ACETAMINOPHEN 5-325 MG PO TABS: 1.0000 | ORAL_TABLET | ORAL | Status: DC | PRN

## 2013-01-21 MED ORDER — CYCLOBENZAPRINE HCL 10 MG PO TABS: 10.0000 mg | ORAL_TABLET | Freq: Three times a day (TID) | ORAL | Status: DC | PRN

## 2013-01-21 NOTE — Discharge Summary (Signed)
Physician Discharge Summary  Patient ID: Mitchell Ross MRN: [id number] DOB/AGE: [birth year] 57 y.o.  Admit date: 01/20/2013 Discharge date: 01/21/2013  Admission Diagnoses:Cervical spondylosis with myelopathy, Cervical stenosis, herniated cervical disc with myelopathy C 34   Discharge Diagnoses: Cervical spondylosis with myelopathy, Cervical stenosis, herniated cervical disc with myelopathy C 34  Active Problems:   * No active hospital problems. *   Discharged Condition: good  Hospital Course: pt admitted on day of surgery  - underwent procedure below  - pt doing well  - less clumsiness , ambulating, taking PO, voiding   Consults: None    Treatments: surgery: ANTERIOR CERVICAL DECOMPRESSION/DISCECTOMY FUSION 1 LEVEL (N/A) - C3-4 Anterior cervical decompression/diskectomy/fusion with allograft, autograft, plate   Discharge Exam: Blood pressure 158/92, pulse 72, temperature 98.6 F (37 C), temperature source Oral, resp. rate 18, SpO2 95.00%. Wound:c/d/i  Disposition: home     Medication List    STOP taking these medications       ibuprofen 200 MG tablet  Commonly known as:  ADVIL,MOTRIN      TAKE these medications       aspirin 325 MG tablet  Take 325 mg by mouth daily.     beta carotene 15 MG capsule  Take 15 mg by mouth daily.     CENTRUM SILVER PO  Take 1 tablet by mouth daily.     Cod Liver Oil Caps  Take 1 capsule by mouth daily.     COLON CLEANSE Caps  Take 1 capsule by mouth daily.     cyclobenzaprine 10 MG tablet  Commonly known as:  FLEXERIL  Take 1 tablet (10 mg total) by mouth 3 (three) times daily as needed for muscle spasms.     fish oil-omega-3 fatty acids 1000 MG capsule  Take 1 g by mouth daily.     Flaxseed Oil 1000 MG Caps  Take 1,000 mg by mouth daily.     folic acid 800 MCG tablet  Commonly known as:  FOLVITE  Take 800 mcg by mouth daily.     Lycopene 10 MG Caps  Take 10 mg by mouth daily.     oxyCODONE-acetaminophen 5-325  MG per tablet  Commonly known as:  PERCOCET/ROXICET  Take 1-2 tablets by mouth every 4 (four) hours as needed for moderate pain.     oxyCODONE-acetaminophen 10-325 MG per tablet  Commonly known as:  PERCOCET  Take 1 tablet by mouth every 6 (six) hours as needed for pain.     pravastatin 20 MG tablet  Commonly known as:  PRAVACHOL  Take 20 mg by mouth at bedtime.     SAW PALMETTO PO  Take 1,800 mg by mouth daily.     selenium 50 MCG Tabs tablet  Take 50 mcg by mouth daily.     VISINE OP  Place 2 drops into both eyes daily as needed (dry eyes).     vitamin B-12 500 MCG tablet  Commonly known as:  CYANOCOBALAMIN  Take 500 mcg by mouth daily.     vitamin B-6 500 MG tablet  Take 500 mg by mouth daily.     zinc gluconate 50 MG tablet  Take 50 mg by mouth daily.         SignedClydene Fake, MD 01/21/2013, 8:05 AM

## 2013-01-21 NOTE — Progress Notes (Signed)
Pt. Alert and oriented,follows simple instructions, denies pain. Incision area without swelling, redness or S/S of infection. Voiding adequate clear yellow urine. Moving all extremities well and vitals stable and documented. Anterior surgery notes instructions given to patient and family member for home safety and precautions. Pt. and family stated understanding of instructions given.

## 2013-06-20 ENCOUNTER — Encounter (INDEPENDENT_AMBULATORY_CARE_PROVIDER_SITE_OTHER): Payer: Self-pay | Admitting: Internal Medicine

## 2013-06-20 ENCOUNTER — Ambulatory Visit (INDEPENDENT_AMBULATORY_CARE_PROVIDER_SITE_OTHER): Payer: Worker's Compensation | Admitting: Internal Medicine

## 2013-06-20 VITALS — BP 126/84 | HR 72 | Temp 97.9°F | Ht 76.0 in | Wt 216.2 lb

## 2013-06-20 DIAGNOSIS — B1920 Unspecified viral hepatitis C without hepatic coma: Secondary | ICD-10-CM

## 2013-06-20 NOTE — Patient Instructions (Signed)
After your labs are back, we will decide which medications to treat you with. We will be in touch. This may take a couple of weeks.

## 2013-06-20 NOTE — Progress Notes (Signed)
Subjective:     Patient ID: Mitchell Ross, male   DOB: [birth year], 58 y.o.   MRN: [id number]  HPI Here today for f/u of his Hepatitis C. He is genotype 1A.   Underwent treatment in 2008-2009 in [place]. He cleared the virus during tx. He had tx failure after one year per patient.  Diagnosed in 2007.  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 Also has one tattoo received in the 1980s while in the service.  06/08/2012 Hep C antibody: reactive.  Hep B surface antigen negative.  Hepatitis A antibody, IgM: negative  Appetite has remained good. No abdominal pain.  He has had about 8 pound weight loss. He underwent neck surgery in November of last year. BMs are normal. No melena or bright red rectal bleeding.   12/02/2005 Colonoscopy Dr. Caro Hight:  FINDINGS:  1. Pancolonic diverticulosis most pronounced in the sigmoid colon.  Multiple thickened sigmoid folds. Otherwise, no polyps, masses,  inflammatory changes or vascular ectasia seen.  2. Small internal hemorrhoids.   12/15/2012 TSH 0.88 CMP     Component Value Date/Time   NA 140 01/19/2013 1356   K 4.2 01/19/2013 1356   CL 105 01/19/2013 1356   CO2 27 01/19/2013 1356   GLUCOSE 104* 01/19/2013 1356   BUN 11 01/19/2013 1356   CREATININE 1.01 01/19/2013 1356   CALCIUM 9.2 01/19/2013 1356   PROT 7.9 01/19/2013 1356   ALBUMIN 3.7 01/19/2013 1356   AST 39* 01/19/2013 1356   ALT 53 01/19/2013 1356   ALKPHOS 61 01/19/2013 1356   BILITOT 0.4 01/19/2013 1356   GFRNONAA 81* 01/19/2013 1356   GFRAA >90 01/19/2013 1356   CBC    Component Value Date/Time   WBC 5.6 01/19/2013 1356   RBC 5.28 01/19/2013 1356   HGB 15.8 01/19/2013 1356   HCT 46.6 01/19/2013 1356   PLT 282 01/19/2013 1356   MCV 88.3 01/19/2013 1356   MCH 29.9 01/19/2013 1356   MCHC 33.9 01/19/2013 1356   RDW 13.8 01/19/2013 1356        07/20/2012 US abdomen: IMPRESSION: There is increased echogenicity of the hepatic  parenchymal  echotexture. No hepatic mass is evident. No definite  surface nodularity is seen. Hepatic veins appear patent. Portal  vein is patent with hepatopetal flow.  02/04/2006 US Liver Biopsy: LIVER, NEEDLE CORE BIOPSIES: CHRONIC MILDLY ACTIVE HEPATITIS CONSISTENT WITH HEPATITIS C CHARACTERIZED BY, MILD ACTIVITY, GRADE 2, NO INCREASED FIBROSIS, STAGE 0.    Review of Systems Works at [employer] in [employer]. No children. Divorced    Past Medical History  Diagnosis Date  . High cholesterol   . Hepatitis C   . Arthritis   . Concussion with brief LOC 05/27/12  . Neck injury 05/27/12  . History of prostate biopsy     states it was negative    Past Surgical History  Procedure Laterality Date  . Torn right knee cartiledge  1970's  . Finger surgery      tip of finger amputation  . Colonoscopy    . Anterior cervical decomp/discectomy fusion N/A 01/20/2013    Procedure: ANTERIOR CERVICAL DECOMPRESSION/DISCECTOMY FUSION 1 LEVEL;  Surgeon: Erline Levine, MD;  Location: [facility];  Service: Neurosurgery;  Laterality: N/A;  C3-4 Anterior cervical decompression/diskectomy/fusion    No Known Allergies  Current Outpatient Prescriptions on File Prior to Visit  Medication Sig Dispense Refill  . aspirin 325 MG tablet  Take 81 mg by mouth daily.       . beta carotene 15 MG capsule Take 15 mg by mouth daily.       Marland Kitchen Cod Liver Oil CAPS Take 1 capsule by mouth daily.       . fish oil-omega-3 fatty acids 1000 MG capsule Take 1 g by mouth daily.       . Flaxseed, Linseed, (FLAXSEED OIL) 1000 MG CAPS Take 1,000 mg by mouth daily.      . folic acid (FOLVITE) 720 MCG tablet Take 800 mcg by mouth daily.      . Lycopene 10 MG CAPS Take 10 mg by mouth daily.      . Misc Natural Products (COLON CLEANSE) CAPS Take 1 capsule by mouth daily.      . Multiple Vitamins-Minerals (CENTRUM SILVER PO) Take 1 tablet by mouth daily.      Marland Kitchen oxyCODONE-acetaminophen (PERCOCET) 10-325 MG per tablet Take 1 tablet by mouth  every 6 (six) hours as needed for pain.      . pravastatin (PRAVACHOL) 20 MG tablet Take 20 mg by mouth at bedtime.       . Pyridoxine HCl (VITAMIN B-6) 500 MG tablet Take 500 mg by mouth daily.       . Saw Palmetto, Serenoa repens, (SAW PALMETTO PO) Take 1,800 mg by mouth daily.      Marland Kitchen selenium 50 MCG TABS Take 50 mcg by mouth daily.       . Tetrahydrozoline HCl (VISINE OP) Place 2 drops into both eyes daily as needed (dry eyes).      . vitamin B-12 (CYANOCOBALAMIN) 500 MCG tablet Take 500 mcg by mouth daily.      Marland Kitchen zinc gluconate 50 MG tablet Take 50 mg by mouth daily.        No current facility-administered medications on file prior to visit.     Objective:   Physical Exam  Filed Vitals:   06/20/13 1427  BP: 126/84  Pulse: 72  Temp: 97.9 F (36.6 C)  Height: 6\' 4"  (1.93 m)  Weight: 216 lb 3.2 oz (98.068 kg)   Alert and oriented. Skin warm and dry. Oral mucosa is moist.   . Sclera anicteric, conjunctivae is pink. Thyroid not enlarged. No cervical lymphadenopathy. Lungs clear. Heart regular rate and rhythm.  Abdomen is soft. Bowel sounds are positive. No hepatomegaly. No abdominal masses felt. No tenderness.  No edema to lower extremities.  Patient is alert and oriented.     Assessment:     Hepatitis C. He has had treatment failure in the past. We will treat once labs and xray back.     Plan:      US abdomen with elastrography, AFP, Hepatitis C quaint, CMET, CBC.  We are going to treat.

## 2013-06-21 ENCOUNTER — Telehealth (INDEPENDENT_AMBULATORY_CARE_PROVIDER_SITE_OTHER): Payer: Self-pay | Admitting: *Deleted

## 2013-06-21 ENCOUNTER — Ambulatory Visit (HOSPITAL_COMMUNITY)
Admission: RE | Admit: 2013-06-21 | Discharge: 2013-06-21 | Disposition: A | Discharge status: Home / Self Care | Payer: BC Managed Care – PPO | Source: Ambulatory Visit | Attending: Internal Medicine | Admitting: Internal Medicine

## 2013-06-21 DIAGNOSIS — K7689 Other specified diseases of liver: Secondary | ICD-10-CM | POA: Insufficient documentation

## 2013-06-21 DIAGNOSIS — B1920 Unspecified viral hepatitis C without hepatic coma: Secondary | ICD-10-CM | POA: Insufficient documentation

## 2013-06-21 DIAGNOSIS — Q6101 Congenital single renal cyst: Secondary | ICD-10-CM | POA: Insufficient documentation

## 2013-06-21 LAB — HEPATITIS C RNA QUANTITATIVE
HCV Quantitative Log: 5.91 {Log} — ABNORMAL HIGH (ref <1.18)
HCV Quantitative: 804127 IU/mL — ABNORMAL HIGH (ref <15)

## 2013-06-21 LAB — HEPATIC FUNCTION PANEL
ALBUMIN: 3.9 g/dL (ref 3.5–5.2)
ALK PHOS: 62 U/L (ref 39–117)
ALT: 51 U/L (ref 0–53)
AST: 38 U/L — ABNORMAL HIGH (ref 0–37)
BILIRUBIN INDIRECT: 0.4 mg/dL (ref 0.2–1.2)
Bilirubin, Direct: 0.1 mg/dL (ref 0.0–0.3)
TOTAL PROTEIN: 7.7 g/dL (ref 6.0–8.3)
Total Bilirubin: 0.5 mg/dL (ref 0.2–1.2)

## 2013-06-21 LAB — CBC WITH DIFFERENTIAL/PLATELET
Basophils Absolute: 0.1 10*3/uL (ref 0.0–0.1)
Basophils Relative: 2 % — ABNORMAL HIGH (ref 0–1)
EOS ABS: 0.1 10*3/uL (ref 0.0–0.7)
EOS PCT: 1 % (ref 0–5)
HEMATOCRIT: 45.7 % (ref 39.0–52.0)
Hemoglobin: 15.4 g/dL (ref 13.0–17.0)
Lymphocytes Relative: 36 % (ref 12–46)
Lymphs Abs: 1.9 10*3/uL (ref 0.7–4.0)
MCH: 28.5 pg (ref 26.0–34.0)
MCHC: 33.7 g/dL (ref 30.0–36.0)
MCV: 84.5 fL (ref 78.0–100.0)
Monocytes Absolute: 0.5 10*3/uL (ref 0.1–1.0)
Monocytes Relative: 9 % (ref 3–12)
Neutro Abs: 2.7 10*3/uL (ref 1.7–7.7)
Neutrophils Relative %: 52 % (ref 43–77)
Platelets: 319 10*3/uL (ref 150–400)
RBC: 5.41 MIL/uL (ref 4.22–5.81)
RDW: 14.7 % (ref 11.5–15.5)
WBC: 5.2 10*3/uL (ref 4.0–10.5)

## 2013-06-21 LAB — AFP TUMOR MARKER: AFP-Tumor Marker: 4.1 ng/mL (ref 0.0–8.0)

## 2013-06-21 NOTE — Telephone Encounter (Signed)
No answer at home #

## 2013-06-21 NOTE — Telephone Encounter (Signed)
Patient came by the office. Results were given to him. I truly thought I had given him the results year but this was not documented. [place] was normal last year.

## 2013-06-21 NOTE — Telephone Encounter (Signed)
Patient would like for you to call him, he wants to know why he didn't a call about results of [place] he had last year -- he can be reached at [phone]

## 2013-07-11 ENCOUNTER — Telehealth (INDEPENDENT_AMBULATORY_CARE_PROVIDER_SITE_OTHER): Payer: Self-pay | Admitting: Internal Medicine

## 2013-07-11 NOTE — Telephone Encounter (Signed)
Will treat with Ribavirin 1200mg  aday. Pegintron 136mcg, Solvaldi 400mg  day.

## 2013-07-11 NOTE — Telephone Encounter (Signed)
Have spoke with patient concerning Hep C treatment.

## 2013-07-13 ENCOUNTER — Telehealth (INDEPENDENT_AMBULATORY_CARE_PROVIDER_SITE_OTHER): Payer: Self-pay | Admitting: *Deleted

## 2013-07-13 NOTE — Telephone Encounter (Signed)
This is for you!

## 2013-07-13 NOTE — Telephone Encounter (Signed)
This will be addressed

## 2013-07-13 NOTE — Telephone Encounter (Signed)
This has been noted.  I will call as I can and try and get this approved. Patient will be contacted.

## 2013-07-13 NOTE — Telephone Encounter (Signed)
[name] called and said he needed to call and giver Mitchell Ross this phone number for authorization: [phone]

## 2013-08-10 ENCOUNTER — Telehealth (INDEPENDENT_AMBULATORY_CARE_PROVIDER_SITE_OTHER): Payer: Self-pay | Admitting: *Deleted

## 2013-08-10 NOTE — Telephone Encounter (Signed)
Mitchell Ross has received a letter that he is approved for Harvoni.  He would like to know what his next step is. The return phone number is [phone] or [phone].

## 2013-08-18 NOTE — Telephone Encounter (Signed)
I have called [facility] @ [phone]. A prescription was left on pharmacist voicemail as written by Lelon Perla. Patient was called at his home number,and a message was left with the above information.

## 2013-08-26 ENCOUNTER — Telehealth (INDEPENDENT_AMBULATORY_CARE_PROVIDER_SITE_OTHER): Payer: Self-pay | Admitting: *Deleted

## 2013-08-26 NOTE — Telephone Encounter (Signed)
I called [facility]. She states that they have the prescription all set and ready to ship to the patient on 08-20-13. Patient called and message left on his cell phone. Ask that he contact our office once he has rec'd the medication so that we may review it with him and get the correct start date. Office visit and Labs to follow.

## 2013-08-30 ENCOUNTER — Telehealth (INDEPENDENT_AMBULATORY_CARE_PROVIDER_SITE_OTHER): Payer: Self-pay | Admitting: *Deleted

## 2013-08-30 NOTE — Telephone Encounter (Signed)
Patient called stating that he rec'd his Harvoni today. Patient states that he has to work Wednesday and Thursday. He wants to wait to start the medication due to the side effects of headache,tiredness. Patient was advised that Lelon Perla would call him and discuss this. His return number is [phone].

## 2013-08-31 NOTE — Telephone Encounter (Signed)
Will start the medication. 09/05/2013. I advised him to call tammy and let her know if he started the medication.

## 2013-08-31 NOTE — Telephone Encounter (Signed)
Donna, Needs OV in 4 weeks. 

## 2013-08-31 NOTE — Telephone Encounter (Signed)
He will call you Monday and let you know if he has started the medication

## 2013-09-01 ENCOUNTER — Telehealth (INDEPENDENT_AMBULATORY_CARE_PROVIDER_SITE_OTHER): Payer: Self-pay | Admitting: *Deleted

## 2013-09-01 NOTE — Telephone Encounter (Signed)
Patient had called and ask how to take his medication. I called the patient and told him that he was to take 1 a day by mouth, with or without food, and at around the same time every day. He voiced back understanding.

## 2013-09-01 NOTE — Telephone Encounter (Signed)
Noted. Mitchell Ross patient will call on Monday to let us know if he starts the medication on 09-05-13. His appointment should be 4 weeks from his start date.

## 2013-09-05 ENCOUNTER — Telehealth (INDEPENDENT_AMBULATORY_CARE_PROVIDER_SITE_OTHER): Payer: Self-pay | Admitting: *Deleted

## 2013-09-05 NOTE — Telephone Encounter (Signed)
Mitchell Ross started his Hep C treatment on 09/05/13. A 4 week f/u apt has been scheduled for 10/04/13 at 2:30 pm with Deberah Castle, NP. Patient was advised to get his lab work done on Monday, 10/03/13, the day before his apt. Voices understood.

## 2013-09-05 NOTE — Telephone Encounter (Signed)
Noted  

## 2013-09-07 ENCOUNTER — Encounter (INDEPENDENT_AMBULATORY_CARE_PROVIDER_SITE_OTHER): Payer: Self-pay | Admitting: *Deleted

## 2013-09-07 ENCOUNTER — Other Ambulatory Visit (INDEPENDENT_AMBULATORY_CARE_PROVIDER_SITE_OTHER): Payer: Self-pay | Admitting: *Deleted

## 2013-09-07 ENCOUNTER — Telehealth (INDEPENDENT_AMBULATORY_CARE_PROVIDER_SITE_OTHER): Payer: Self-pay | Admitting: *Deleted

## 2013-09-07 DIAGNOSIS — B182 Chronic viral hepatitis C: Secondary | ICD-10-CM

## 2013-09-07 NOTE — Telephone Encounter (Signed)
Mitchell Ross started his Hep C treatment on 09/05/13. A 4 week f/u apt has been scheduled for 10/04/13 at 2:30 pm with Deberah Castle, NP. Patient was advised to get his lab work done on Monday, 10/03/13, the day before his apt. Voices understood.

## 2013-09-07 NOTE — Telephone Encounter (Signed)
Labs are noted for 10/03/13. Orders have been released,and a letter sent to the patient as a reminder.

## 2013-09-07 NOTE — Telephone Encounter (Signed)
.  Per Lelon Perla patient to have lab work prior to [facility]. These are his labs post 4 weeks into Hep C treatment.

## 2013-10-03 LAB — CBC
HEMATOCRIT: 45.3 % (ref 39.0–52.0)
Hemoglobin: 15.4 g/dL (ref 13.0–17.0)
MCH: 28.8 pg (ref 26.0–34.0)
MCHC: 34 g/dL (ref 30.0–36.0)
MCV: 84.8 fL (ref 78.0–100.0)
Platelets: 277 10*3/uL (ref 150–400)
RBC: 5.34 MIL/uL (ref 4.22–5.81)
RDW: 14.6 % (ref 11.5–15.5)
WBC: 5.4 10*3/uL (ref 4.0–10.5)

## 2013-10-03 LAB — HEPATIC FUNCTION PANEL
ALK PHOS: 43 U/L (ref 39–117)
ALT: 18 U/L (ref 0–53)
AST: 21 U/L (ref 0–37)
Albumin: 3.8 g/dL (ref 3.5–5.2)
BILIRUBIN INDIRECT: 0.4 mg/dL (ref 0.2–1.2)
Bilirubin, Direct: 0.1 mg/dL (ref 0.0–0.3)
TOTAL PROTEIN: 6.9 g/dL (ref 6.0–8.3)
Total Bilirubin: 0.5 mg/dL (ref 0.2–1.2)

## 2013-10-04 ENCOUNTER — Encounter (INDEPENDENT_AMBULATORY_CARE_PROVIDER_SITE_OTHER): Payer: Self-pay | Admitting: Internal Medicine

## 2013-10-04 ENCOUNTER — Ambulatory Visit (INDEPENDENT_AMBULATORY_CARE_PROVIDER_SITE_OTHER): Payer: BC Managed Care – PPO | Admitting: Internal Medicine

## 2013-10-04 VITALS — BP 140/80 | HR 56 | Temp 97.7°F | Ht 76.0 in | Wt 212.4 lb

## 2013-10-04 DIAGNOSIS — B182 Chronic viral hepatitis C: Secondary | ICD-10-CM

## 2013-10-04 NOTE — Patient Instructions (Signed)
OV in 4 weeks with a CBC with Diff, Hepatic function, Hep C quaint

## 2013-10-04 NOTE — Progress Notes (Signed)
Subjective:     Patient ID: Mitchell Ross, male   DOB: 03-17-1956, 58 y.o.   MRN: [id number]  HPI HPI Here today for f/u of his Hepatitis C. He is genotype 1A.  Started Harvoni 09/05/2013. Will treat for 12 weeks.  Underwent treatment in 2008-2009 in [place]. He cleared the virus during tx. He had tx failure after one year per patient. Diagnosed in 2007.  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 Also has one tattoo received in the 1980s while in the service.  06/08/2012 Hep C antibody: reactive.  Hep B surface antigen negative.  Hepatitis A antibody, IgM: negative  He tells me he feels tired but he is okay. Appetite is good. No weight loss. No rash. No abdominal. No icteric. BMs are normal.  He denies any depression. CBC    Component Value Date/Time   WBC 5.4 10/03/2013 0936   RBC 5.34 10/03/2013 0936   HGB 15.4 10/03/2013 0936   HCT 45.3 10/03/2013 0936   PLT 277 10/03/2013 0936   MCV 84.8 10/03/2013 0936   MCH 28.8 10/03/2013 0936   MCHC 34.0 10/03/2013 0936   RDW 14.6 10/03/2013 0936   LYMPHSABS 1.9 06/20/2013 1455   MONOABS 0.5 06/20/2013 1455   EOSABS 0.1 06/20/2013 1455   BASOSABS 0.1 06/20/2013 1455    Hepatic Function Panel     Component Value Date/Time   PROT 6.9 10/03/2013 0932   ALBUMIN 3.8 10/03/2013 0932   AST 21 10/03/2013 0932   ALT 18 10/03/2013 0932   ALKPHOS 43 10/03/2013 0932   BILITOT 0.5 10/03/2013 0932   BILIDIR 0.1 10/03/2013 0932   IBILI 0.4 10/03/2013 0932       06/20/2013 US abdomen with elastrography: IMPRESSION:  ULTRASOUND ABDOMEN:  1. No evidence of cirrhosis.  2. No acute abdominal process.  3. Portions of anatomy obscured by overlying bowel gas.  4. Right renal cyst or minimally complex cyst.  ULTRASOUND HEPATIC ELASTOGRAHY:  Median hepatic shear wave velocity is calculated at 0.79 m/sec,  which corresponds to a Metavir fibrosis score of F0.   Appetite has remained good. No abdominal pain. He has had  about 8 pound weight loss. He underwent neck surgery in November of last year.  BMs are normal. No melena or bright red rectal bleeding.  12/02/2005 Colonoscopy Dr. Caro Hight:  FINDINGS:  1. Pancolonic diverticulosis most pronounced in the sigmoid colon.  Multiple thickened sigmoid folds. Otherwise, no polyps, masses,  inflammatory changes or vascular ectasia seen.  2. Small internal hemorrhoids.     Review of Systems Past Medical History  Diagnosis Date  . High cholesterol   . Hepatitis C   . Arthritis   . Concussion with brief LOC 05/27/12  . Neck injury 05/27/12  . History of prostate biopsy     states it was negative    Past Surgical History  Procedure Laterality Date  . Torn right knee cartiledge  1970's  . Finger surgery      tip of finger amputation  . Colonoscopy    . Anterior cervical decomp/discectomy fusion N/A 01/20/2013    Procedure: ANTERIOR CERVICAL DECOMPRESSION/DISCECTOMY FUSION 1 LEVEL;  Surgeon: Erline Levine, MD;  Location: [facility];  Service: Neurosurgery;  Laterality: N/A;  C3-4 Anterior cervical decompression/diskectomy/fusion    No Known Allergies  Current Outpatient Prescriptions on File Prior to Visit  Medication Sig Dispense Refill  . aspirin 325 MG tablet Take  81 mg by mouth daily.       . Lycopene 10 MG CAPS Take 10 mg by mouth daily.      . Misc Natural Products (COLON CLEANSE) CAPS Take 1 capsule by mouth daily.      . Multiple Vitamins-Minerals (CENTRUM SILVER PO) Take 1 tablet by mouth daily.      Marland Kitchen oxyCODONE-acetaminophen (PERCOCET) 10-325 MG per tablet Take 1 tablet by mouth every 4 (four) hours as needed for pain.      . pravastatin (PRAVACHOL) 20 MG tablet Take 20 mg by mouth at bedtime.       . Saw Palmetto, Serenoa repens, (SAW PALMETTO PO) Take 1,800 mg by mouth daily.      . Tetrahydrozoline HCl (VISINE OP) Place 2 drops into both eyes daily as needed (dry eyes).       No current facility-administered medications on file prior  to visit.        Objective:   Physical Exam  Filed Vitals:   10/04/13 1436  BP: 140/80  Pulse: 56  Temp: 97.7 F (36.5 C)  Height: 6\' 4"  (1.93 m)  Weight: 212 lb 6.4 oz (96.344 kg)   Alert and oriented. Skin warm and dry. Oral mucosa is moist.   . Sclera anicteric, conjunctivae is pink. Thyroid not enlarged. No cervical lymphadenopathy. Lungs clear. Heart regular rate and rhythm.  Abdomen is soft. Bowel sounds are positive. No hepatomegaly. No abdominal masses felt. No tenderness.  No edema to lower extremities.       Assessment:    Hep. C. He is doing well. Hep Quaint pending.     Plan:     OV in 4 weeks with Hep C quaint, Hepatic profile and CBC with diff.

## 2013-10-05 ENCOUNTER — Telehealth (INDEPENDENT_AMBULATORY_CARE_PROVIDER_SITE_OTHER): Payer: Self-pay | Admitting: *Deleted

## 2013-10-05 ENCOUNTER — Other Ambulatory Visit (INDEPENDENT_AMBULATORY_CARE_PROVIDER_SITE_OTHER): Payer: Self-pay | Admitting: *Deleted

## 2013-10-05 ENCOUNTER — Encounter (INDEPENDENT_AMBULATORY_CARE_PROVIDER_SITE_OTHER): Payer: Self-pay | Admitting: *Deleted

## 2013-10-05 DIAGNOSIS — B182 Chronic viral hepatitis C: Secondary | ICD-10-CM

## 2013-10-05 NOTE — Telephone Encounter (Signed)
.  Per [name] Setzer,NP patient to have labs drawn in 4 weeks 

## 2013-10-08 LAB — HEPATITIS C RNA QUANTITATIVE: HCV Quantitative: NOT DETECTED IU/mL (ref <15)

## 2013-11-03 ENCOUNTER — Encounter (INDEPENDENT_AMBULATORY_CARE_PROVIDER_SITE_OTHER): Payer: Self-pay | Admitting: Internal Medicine

## 2013-11-03 ENCOUNTER — Ambulatory Visit (INDEPENDENT_AMBULATORY_CARE_PROVIDER_SITE_OTHER): Payer: Worker's Compensation | Admitting: Internal Medicine

## 2013-11-03 VITALS — BP 116/70 | HR 70 | Temp 98.6°F | Ht 74.0 in | Wt 220.5 lb

## 2013-11-03 DIAGNOSIS — B182 Chronic viral hepatitis C: Secondary | ICD-10-CM

## 2013-11-03 LAB — HEPATIC FUNCTION PANEL
ALBUMIN: 4.1 g/dL (ref 3.5–5.2)
ALK PHOS: 46 U/L (ref 39–117)
ALT: 16 U/L (ref 0–53)
AST: 20 U/L (ref 0–37)
BILIRUBIN TOTAL: 0.5 mg/dL (ref 0.2–1.2)
Bilirubin, Direct: 0.1 mg/dL (ref 0.0–0.3)
Indirect Bilirubin: 0.4 mg/dL (ref 0.2–1.2)
Total Protein: 7.3 g/dL (ref 6.0–8.3)

## 2013-11-03 LAB — CBC WITH DIFFERENTIAL/PLATELET
BASOS ABS: 0.1 10*3/uL (ref 0.0–0.1)
Basophils Relative: 1 % (ref 0–1)
EOS PCT: 2 % (ref 0–5)
Eosinophils Absolute: 0.1 10*3/uL (ref 0.0–0.7)
HEMATOCRIT: 40 % (ref 39.0–52.0)
Hemoglobin: 13.7 g/dL (ref 13.0–17.0)
Lymphocytes Relative: 43 % (ref 12–46)
Lymphs Abs: 2.3 10*3/uL (ref 0.7–4.0)
MCH: 28.8 pg (ref 26.0–34.0)
MCHC: 34.3 g/dL (ref 30.0–36.0)
MCV: 84 fL (ref 78.0–100.0)
MONO ABS: 0.5 10*3/uL (ref 0.1–1.0)
Monocytes Relative: 9 % (ref 3–12)
Neutro Abs: 2.4 10*3/uL (ref 1.7–7.7)
Neutrophils Relative %: 45 % (ref 43–77)
Platelets: 294 10*3/uL (ref 150–400)
RBC: 4.76 MIL/uL (ref 4.22–5.81)
RDW: 14.5 % (ref 11.5–15.5)
WBC: 5.3 10*3/uL (ref 4.0–10.5)

## 2013-11-03 NOTE — Patient Instructions (Signed)
OV in  8 weeks with hepatic profile, cbc, and a Hep C quaint

## 2013-11-03 NOTE — Progress Notes (Signed)
Subjective:     Patient ID: Mitchell Ross, male   DOB: 03/26/1955, 58 y.o.   MRN: [id number]  HPI HPI Here today for f/u of his Hepatitis C. He is genotype 1A. Started Harvoni 09/05/2013. Will treat for 12 weeks.  Viral load is undetectable in July.  This is week 8 for him.  Underwent treatment in 2008-2009 in [place]. He cleared the virus during tx. He had tx failure after one year per patient. Diagnosed in 2007.  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 Also has one tattoo received in the 1980s while in the service.  06/08/2012 Hep C antibody: reactive.  Hep B surface antigen negative.  Hepatitis A antibody, IgM: negative   . He feels good. Sometimes he feels tired. Appetite is good. No weight loss. Usually has a BM daily. No BRRB or melena. No problems working. Continues to work full time.  Hep C quaint pending- CBC    Component Value Date/Time   WBC 5.3 11/02/2013 1558   RBC 4.76 11/02/2013 1558   HGB 13.7 11/02/2013 1558   HCT 40.0 11/02/2013 1558   PLT 294 11/02/2013 1558   MCV 84.0 11/02/2013 1558   MCH 28.8 11/02/2013 1558   MCHC 34.3 11/02/2013 1558   RDW 14.5 11/02/2013 1558   LYMPHSABS 2.3 11/02/2013 1558   MONOABS 0.5 11/02/2013 1558   EOSABS 0.1 11/02/2013 1558   BASOSABS 0.1 11/02/2013 1558     CMP     Component Value Date/Time   NA 140 01/19/2013 1356   K 4.2 01/19/2013 1356   CL 105 01/19/2013 1356   CO2 27 01/19/2013 1356   GLUCOSE 104* 01/19/2013 1356   BUN 11 01/19/2013 1356   CREATININE 1.01 01/19/2013 1356   CALCIUM 9.2 01/19/2013 1356   PROT 7.3 11/02/2013 1556   ALBUMIN 4.1 11/02/2013 1556   AST 20 11/02/2013 1556   ALT 16 11/02/2013 1556   ALKPHOS 46 11/02/2013 1556   BILITOT 0.5 11/02/2013 1556   GFRNONAA 81* 01/19/2013 1356   GFRAA >90 01/19/2013 1356      Review of Systems Past Medical History  Diagnosis Date  . High cholesterol   . Hepatitis C   . Arthritis   . Concussion with brief LOC 05/27/12  . Neck  injury 05/27/12  . History of prostate biopsy     states it was negative    Past Surgical History  Procedure Laterality Date  . Torn right knee cartiledge  1970's  . Finger surgery      tip of finger amputation  . Colonoscopy    . Anterior cervical decomp/discectomy fusion N/A 01/20/2013    Procedure: ANTERIOR CERVICAL DECOMPRESSION/DISCECTOMY FUSION 1 LEVEL;  Surgeon: Erline Levine, MD;  Location: [facility];  Service: Neurosurgery;  Laterality: N/A;  C3-4 Anterior cervical decompression/diskectomy/fusion    No Known Allergies  Current Outpatient Prescriptions on File Prior to Visit  Medication Sig Dispense Refill  . aspirin 325 MG tablet Take 81 mg by mouth daily.       . Ledipasvir-Sofosbuvir (HARVONI) 90-400 MG TABS Take by mouth.      . Lycopene 10 MG CAPS Take 10 mg by mouth daily.      . Misc Natural Products (COLON CLEANSE) CAPS Take 1 capsule by mouth daily.      . Multiple Vitamins-Minerals (CENTRUM SILVER PO) Take 1 tablet by mouth daily.      Marland Kitchen oxyCODONE-acetaminophen (PERCOCET)  10-325 MG per tablet Take 1 tablet by mouth every 4 (four) hours as needed for pain.      . pravastatin (PRAVACHOL) 20 MG tablet Take 20 mg by mouth at bedtime.       . Saw Palmetto, Serenoa repens, (SAW PALMETTO PO) Take 1,800 mg by mouth daily.      . Tetrahydrozoline HCl (VISINE OP) Place 2 drops into both eyes daily as needed (dry eyes).       No current facility-administered medications on file prior to visit.        Objective:   Physical Exam  Filed Vitals:   11/03/13 1646  BP: 116/70  Pulse: 70  Temp: 98.6 F (37 C)  Height: 6\' 2"  (1.88 m)  Weight: 220 lb 8 oz (100.018 kg)   Alert and oriented. Skin warm and dry. Oral mucosa is moist.   . Sclera anicteric, conjunctivae is pink. Thyroid not enlarged. No cervical lymphadenopathy. Lungs clear. Heart regular rate and rhythm.  Abdomen is soft. Bowel sounds are positive. No hepatomegaly. No abdominal masses felt. No tenderness.  No  edema to lower extremities.       Assessment:     Hep C. He has cleared the virus. Doing good. Only complaint is feeling tired at time. Hep C quaint pending.    Plan:     OV in 8 weeks wiith a CBC, Hepatic function, and a hep C quaint.

## 2013-11-04 LAB — HEPATITIS C RNA QUANTITATIVE: HCV Quantitative: NOT DETECTED IU/mL (ref <15)

## 2013-11-09 ENCOUNTER — Telehealth (INDEPENDENT_AMBULATORY_CARE_PROVIDER_SITE_OTHER): Payer: Self-pay | Admitting: *Deleted

## 2013-11-09 DIAGNOSIS — B182 Chronic viral hepatitis C: Secondary | ICD-10-CM

## 2013-11-09 NOTE — Telephone Encounter (Signed)
.  Per Lelon Perla patient is to have labs drawn in 8 weeks.

## 2013-12-07 ENCOUNTER — Encounter (INDEPENDENT_AMBULATORY_CARE_PROVIDER_SITE_OTHER): Payer: Self-pay | Admitting: *Deleted

## 2013-12-07 ENCOUNTER — Other Ambulatory Visit (INDEPENDENT_AMBULATORY_CARE_PROVIDER_SITE_OTHER): Payer: Self-pay | Admitting: *Deleted

## 2013-12-07 DIAGNOSIS — B182 Chronic viral hepatitis C: Secondary | ICD-10-CM

## 2013-12-30 ENCOUNTER — Other Ambulatory Visit: Payer: Self-pay

## 2014-01-03 IMAGING — DX DG CERVICAL SPINE 2 OR 3 VIEWS
1 series · 1 of 1 positions shown · non-contrast
Comparison: Cervical MRI 12/24/2012.

CLINICAL DATA: 57-year- male undergoing cervical spine surgery.
Initial encounter.

EXAM:
CERVICAL SPINE - 2-3 VIEW

[lat]
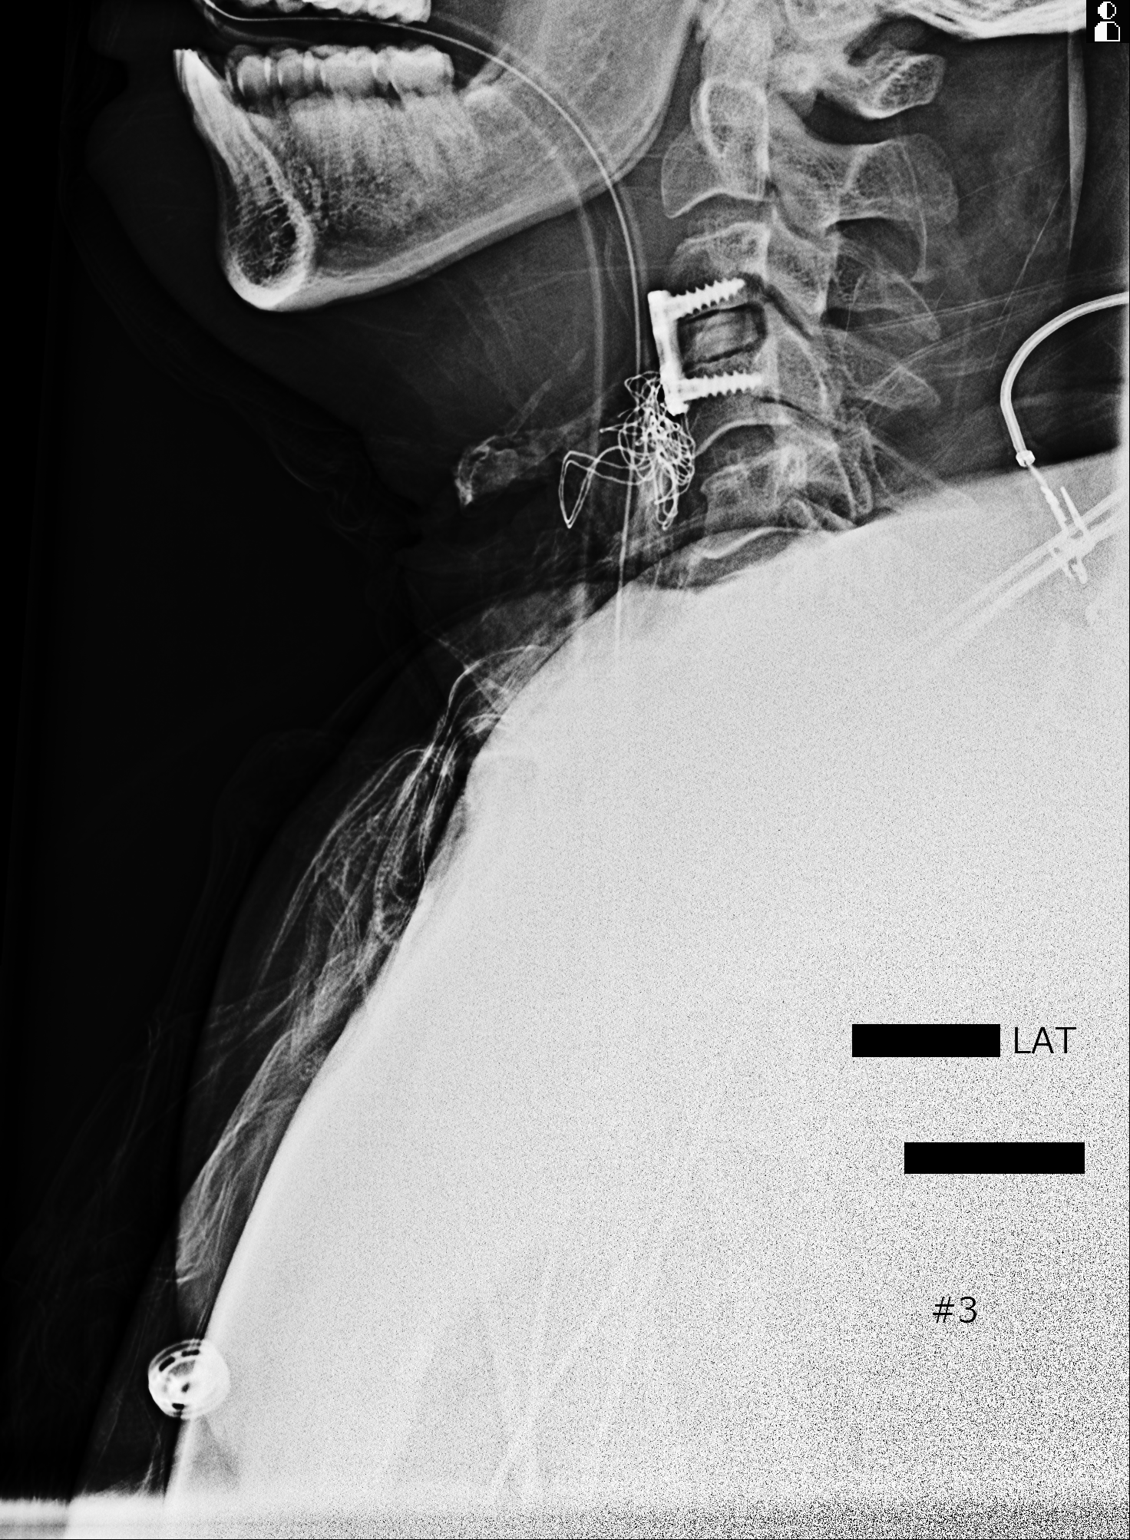

[1 of 1 positions shown; findings below may reference images not displayed]

FINDINGS: 3 intraoperative portable cross-table lateral views of the cervical
spine.

Film #1. Surgical probe projects at the C4-C5 disc space.

Film #2. Surgical probe projects at the C3-C4 disc space.

Film #3.  C3-C4 ACDF changes.
IMPRESSION: C3-C4 ACDF.

## 2014-01-04 ENCOUNTER — Encounter (INDEPENDENT_AMBULATORY_CARE_PROVIDER_SITE_OTHER): Payer: Self-pay | Admitting: *Deleted

## 2014-01-04 ENCOUNTER — Ambulatory Visit (INDEPENDENT_AMBULATORY_CARE_PROVIDER_SITE_OTHER): Payer: BC Managed Care – PPO | Admitting: Internal Medicine

## 2014-01-04 ENCOUNTER — Encounter (INDEPENDENT_AMBULATORY_CARE_PROVIDER_SITE_OTHER): Payer: Self-pay | Admitting: Internal Medicine

## 2014-01-04 VITALS — BP 162/74 | HR 60 | Temp 97.5°F | Ht 74.0 in | Wt 220.5 lb

## 2014-01-04 DIAGNOSIS — B1920 Unspecified viral hepatitis C without hepatic coma: Secondary | ICD-10-CM

## 2014-01-04 NOTE — Patient Instructions (Signed)
OV in 6 months. 

## 2014-01-04 NOTE — Progress Notes (Signed)
Subjective:    Patient ID: Mitchell Ross, male    DOB: 11-10-1955, 59 y.o.   MRN: [id number]  HPI HPI Here today for f/u of his Hepatitis C. He is genotype 1A. Started Harvoni 09/05/2013. Treated for 12 weeks.. Viral load is undetectable in July. Finished Harvoni in September. Underwent treatment in 2008-2009 in [place]. He cleared the virus during tx. He had tx failure after one year per patient. Diagnosed in 2007.  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 Also has one tattoo received in the 1980s while in the service.   06/08/2012 Hep C antibody: reactive.  Hep B surface antigen negative.  Hepatitis A antibody, IgM: negative .  He feels good.  Marland Kitchen Appetite is good. No weight loss.  Usually has a BM daily. No BRRB or melena.  No problems working. Continues to work full time at [employer] No rashes. He has arthritis and does have joint pain in his toes. 11/02/2013 Hep C. Quain undetectable.    06/21/2013 [place] Elastrography: ULTRASOUND HEPATIC ELASTOGRAHY:  Median hepatic shear wave velocity is calculated at 0.79 m/sec,  which corresponds to a Metavir fibrosis score of F0.   Hepatic Function Panel     Component Value Date/Time   PROT 7.3 11/02/2013 1556   ALBUMIN 4.1 11/02/2013 1556   AST 20 11/02/2013 1556   ALT 16 11/02/2013 1556   ALKPHOS 46 11/02/2013 1556   BILITOT 0.5 11/02/2013 1556   BILIDIR 0.1 11/02/2013 1556   IBILI 0.4 11/02/2013 1556      CBC    Component Value Date/Time   WBC 5.3 11/02/2013 1558   RBC 4.76 11/02/2013 1558   HGB 13.7 11/02/2013 1558   HCT 40.0 11/02/2013 1558   PLT 294 11/02/2013 1558   MCV 84.0 11/02/2013 1558   MCH 28.8 11/02/2013 1558   MCHC 34.3 11/02/2013 1558   RDW 14.5 11/02/2013 1558   LYMPHSABS 2.3 11/02/2013 1558   MONOABS 0.5 11/02/2013 1558   EOSABS 0.1 11/02/2013 1558   BASOSABS 0.1 11/02/2013 1558          Review of Systems Past Medical History  Diagnosis Date  . High cholesterol     . Hepatitis C   . Arthritis   . Concussion with brief LOC 05/27/12  . Neck injury 05/27/12  . History of prostate biopsy     states it was negative    Past Surgical History  Procedure Laterality Date  . Torn right knee cartiledge  1970's  . Finger surgery      tip of finger amputation  . Colonoscopy    . Anterior cervical decomp/discectomy fusion N/A 01/20/2013    Procedure: ANTERIOR CERVICAL DECOMPRESSION/DISCECTOMY FUSION 1 LEVEL;  Surgeon: Erline Levine, MD;  Location: [facility];  Service: Neurosurgery;  Laterality: N/A;  C3-4 Anterior cervical decompression/diskectomy/fusion    No Known Allergies  Current Outpatient Prescriptions on File Prior to Visit  Medication Sig Dispense Refill  . aspirin 325 MG tablet Take 81 mg by mouth daily.       . Lycopene 10 MG CAPS Take 10 mg by mouth daily.      . meloxicam (MOBIC) 15 MG tablet Take by mouth daily.      . Misc Natural Products (COLON CLEANSE) CAPS Take 1 capsule by mouth daily.      . Multiple Vitamins-Minerals (CENTRUM SILVER PO) Take 1 tablet by mouth daily.      Marland Kitchen  oxyCODONE-acetaminophen (PERCOCET) 10-325 MG per tablet Take 1 tablet by mouth every 4 (four) hours as needed for pain.      . pravastatin (PRAVACHOL) 20 MG tablet Take 20 mg by mouth at bedtime.       . Saw Palmetto, Serenoa repens, (SAW PALMETTO PO) Take 1,800 mg by mouth daily.      . Tetrahydrozoline HCl (VISINE OP) Place 2 drops into both eyes daily as needed (dry eyes).      . Ledipasvir-Sofosbuvir (HARVONI) 90-400 MG TABS Take by mouth.       No current facility-administered medications on file prior to visit.        Objective:   Physical Exam  Filed Vitals:   01/04/14 1552  BP: 162/74  Pulse: 60  Temp: 97.5 F (36.4 C)  Height: 6\' 2"  (1.88 m)  Weight: 220 lb 8 oz (100.018 kg)   Alert and oriented. Skin warm and dry. Oral mucosa is moist.   . Sclera anicteric, conjunctivae is pink. Thyroid not enlarged. No cervical lymphadenopathy. Lungs clear.  Heart regular rate and rhythm.  Abdomen is soft. Bowel sounds are positive. No hepatomegaly. No abdominal masses felt. No tenderness.  No edema to lower extremities.          Assessment & Plan:  Hepatitis C. He has cleared the virus. Doing well.  Hep C quaint, CBC and Liver profile are pending.  [place] RUQ for surveillance OV in 6 months. CBC, Hepatic Profile, Hep C quaint, AFP 6 months.

## 2014-01-05 LAB — CBC WITH DIFFERENTIAL/PLATELET
BASOS ABS: 0.1 10*3/uL (ref 0.0–0.1)
Basophils Relative: 1 % (ref 0–1)
Eosinophils Absolute: 0.1 10*3/uL (ref 0.0–0.7)
Eosinophils Relative: 1 % (ref 0–5)
HCT: 43.6 % (ref 39.0–52.0)
Hemoglobin: 14.5 g/dL (ref 13.0–17.0)
LYMPHS PCT: 30 % (ref 12–46)
Lymphs Abs: 1.7 10*3/uL (ref 0.7–4.0)
MCH: 28.5 pg (ref 26.0–34.0)
MCHC: 33.3 g/dL (ref 30.0–36.0)
MCV: 85.7 fL (ref 78.0–100.0)
MONO ABS: 0.5 10*3/uL (ref 0.1–1.0)
Monocytes Relative: 8 % (ref 3–12)
NEUTROS ABS: 3.5 10*3/uL (ref 1.7–7.7)
NEUTROS PCT: 60 % (ref 43–77)
PLATELETS: 293 10*3/uL (ref 150–400)
RBC: 5.09 MIL/uL (ref 4.22–5.81)
RDW: 13.9 % (ref 11.5–15.5)
WBC: 5.8 10*3/uL (ref 4.0–10.5)

## 2014-01-05 LAB — HEPATITIS C RNA QUANTITATIVE: HCV QUANT: NOT DETECTED [IU]/mL (ref <15)

## 2014-01-05 LAB — HEPATIC FUNCTION PANEL
ALT: 19 U/L (ref 0–53)
AST: 23 U/L (ref 0–37)
Albumin: 4 g/dL (ref 3.5–5.2)
Alkaline Phosphatase: 48 U/L (ref 39–117)
BILIRUBIN DIRECT: 0.1 mg/dL (ref 0.0–0.3)
BILIRUBIN INDIRECT: 0.3 mg/dL (ref 0.2–1.2)
Total Bilirubin: 0.4 mg/dL (ref 0.2–1.2)
Total Protein: 7 g/dL (ref 6.0–8.3)

## 2014-01-12 ENCOUNTER — Telehealth (INDEPENDENT_AMBULATORY_CARE_PROVIDER_SITE_OTHER): Payer: Self-pay | Admitting: *Deleted

## 2014-01-12 DIAGNOSIS — B182 Chronic viral hepatitis C: Secondary | ICD-10-CM

## 2014-01-12 NOTE — Telephone Encounter (Signed)
..  Per Lelon Perla patient to have labs drawn in 6 months - noted to be drawn on 07/09/13.

## 2014-01-19 ENCOUNTER — Ambulatory Visit (HOSPITAL_COMMUNITY)
Admission: RE | Admit: 2014-01-19 | Discharge: 2014-01-19 | Disposition: A | Discharge status: Home / Self Care | Payer: BC Managed Care – PPO | Source: Ambulatory Visit | Attending: Internal Medicine | Admitting: Internal Medicine

## 2014-01-19 DIAGNOSIS — B1920 Unspecified viral hepatitis C without hepatic coma: Secondary | ICD-10-CM | POA: Diagnosis not present

## 2014-01-19 DIAGNOSIS — D134 Benign neoplasm of liver: Secondary | ICD-10-CM | POA: Diagnosis not present

## 2014-01-19 DIAGNOSIS — Z09 Encounter for follow-up examination after completed treatment for conditions other than malignant neoplasm: Secondary | ICD-10-CM | POA: Insufficient documentation

## 2014-06-14 ENCOUNTER — Other Ambulatory Visit (INDEPENDENT_AMBULATORY_CARE_PROVIDER_SITE_OTHER): Payer: Self-pay | Admitting: *Deleted

## 2014-06-14 ENCOUNTER — Encounter (INDEPENDENT_AMBULATORY_CARE_PROVIDER_SITE_OTHER): Payer: Self-pay | Admitting: *Deleted

## 2014-06-14 DIAGNOSIS — B182 Chronic viral hepatitis C: Secondary | ICD-10-CM

## 2014-07-10 ENCOUNTER — Encounter (INDEPENDENT_AMBULATORY_CARE_PROVIDER_SITE_OTHER): Payer: Self-pay | Admitting: Internal Medicine

## 2014-07-10 ENCOUNTER — Ambulatory Visit (INDEPENDENT_AMBULATORY_CARE_PROVIDER_SITE_OTHER): Payer: BLUE CROSS/BLUE SHIELD | Admitting: Internal Medicine

## 2014-07-10 VITALS — BP 150/96 | HR 74 | Temp 98.4°F | Resp 18 | Ht 74.0 in | Wt 225.1 lb

## 2014-07-10 DIAGNOSIS — Z8619 Personal history of other infectious and parasitic diseases: Secondary | ICD-10-CM | POA: Diagnosis not present

## 2014-07-10 NOTE — Progress Notes (Signed)
Presenting complaint;  Follow-up for hepatitis C. Patient was treated last year.  Database;  Mitchell Ross this 59 year old F medical male who is here for scheduled visit. He has history of chronic hepatitis C genotype 1a stage 0 who was treated last year for 12 weeks. HCV RNA was undetectable at completion of therapy but he has not had a chance to check for SVR.  Last screening colonoscopy was in September 2007 and no polyps were found( dr.Fields).  Subjective;  Patient has no complaints. He remains with good appetite. He denies abdominal pain lower extremity edema nausea vomiting melena or rectal bleeding. His bowels move daily. He has gained 5 pounds since his last visit. He states since he had neck surgery in November 2014 he has noted improvement in muscle weakness and he is able to walk without any difficulty. He still has tingling in his toes and fingertips but he says is also getting better.   Current Medications: Outpatient Encounter Prescriptions as of 07/10/2014  Medication Sig  . aspirin 325 MG tablet Take 325 mg by mouth daily.   . Lycopene 10 MG CAPS Take 10 mg by mouth daily.  . meloxicam (MOBIC) 15 MG tablet Take by mouth daily.  . Misc Natural Products (COLON CLEANSE) CAPS Take 1 capsule by mouth daily.  . Multiple Vitamins-Minerals (CENTRUM SILVER PO) Take 1 tablet by mouth daily.  Marland Kitchen oxyCODONE-acetaminophen (PERCOCET) 10-325 MG per tablet Take 1 tablet by mouth every 4 (four) hours as needed for pain.  . pravastatin (PRAVACHOL) 20 MG tablet Take 20 mg by mouth at bedtime.   . Saw Palmetto, Serenoa repens, (SAW PALMETTO PO) Take 1,800 mg by mouth daily.  . Tetrahydrozoline HCl (VISINE OP) Place 2 drops into both eyes daily as needed (dry eyes).  . Ledipasvir-Sofosbuvir (HARVONI) 90-400 MG TABS Take by mouth.     Objective: Blood pressure 150/96, pulse 74, temperature 98.4 F (36.9 C), temperature source Oral, resp. rate 18, height 6\' 2"  (1.88 m), weight 225 lb 1.6 oz  (102.105 kg). Patient is alert and in no acute distress. Conjunctiva is pink. Sclera is nonicteric Oropharyngeal mucosa is normal. No neck masses or thyromegaly noted. Cardiac exam with regular rhythm normal S1 and S2. No murmur or gallop noted. Lungs are clear to auscultation. Abdomen is full but soft and nontender without organomegaly or masses. No LE edema or clubbing noted.  Labs/studies Results: Lab data from this morning is pending.  Lab data from 01/04/2014 WBC 5.8, H&H 14.5 and 43.6 and platelet count 293K Bilirubin 0.4, AP 48, AST 23, ALT 19 and albumin 4.0 HCV RNA was undetectable.     Assessment:  #1. History of hepatitis C genotype 1a stage 0 disease. Patient completed 12 weeks of Harvoni in September last year. However he is not follow-up lab studies to document SVR. He has no stigmata of chronic liver disease. #2. Patient is average risk for CRC. Last colonoscopy was in September 2007 and next one would be due in September 2017.    Plan:  I will be contacting patient results of CBC and HCV RNA. Patient encouraged to walk or exercise regularly. Screening colonoscopy in September 2017. Office visit on as-needed basis.

## 2014-07-10 NOTE — Patient Instructions (Signed)
Physician will call with results of blood tests. Next screening colonoscopy in September 2017

## 2014-07-11 LAB — CBC WITH DIFFERENTIAL/PLATELET
BASOS ABS: 0 10*3/uL (ref 0.0–0.1)
Basophils Relative: 1 % (ref 0–1)
Eosinophils Absolute: 0.1 10*3/uL (ref 0.0–0.7)
Eosinophils Relative: 2 % (ref 0–5)
HCT: 44.5 % (ref 39.0–52.0)
Hemoglobin: 14.7 g/dL (ref 13.0–17.0)
LYMPHS ABS: 1.5 10*3/uL (ref 0.7–4.0)
Lymphocytes Relative: 34 % (ref 12–46)
MCH: 28.2 pg (ref 26.0–34.0)
MCHC: 33 g/dL (ref 30.0–36.0)
MCV: 85.2 fL (ref 78.0–100.0)
MPV: 10.3 fL (ref 8.6–12.4)
Monocytes Absolute: 0.5 10*3/uL (ref 0.1–1.0)
Monocytes Relative: 10 % (ref 3–12)
NEUTROS ABS: 2.4 10*3/uL (ref 1.7–7.7)
NEUTROS PCT: 53 % (ref 43–77)
PLATELETS: 278 10*3/uL (ref 150–400)
RBC: 5.22 MIL/uL (ref 4.22–5.81)
RDW: 14.3 % (ref 11.5–15.5)
WBC: 4.5 10*3/uL (ref 4.0–10.5)

## 2014-07-11 LAB — HEPATITIS C RNA QUANTITATIVE: HCV QUANT: NOT DETECTED [IU]/mL (ref <15)

## 2014-07-11 LAB — AFP TUMOR MARKER: AFP-Tumor Marker: 3.6 ng/mL (ref <6.1)

## 2014-07-14 ENCOUNTER — Telehealth (INDEPENDENT_AMBULATORY_CARE_PROVIDER_SITE_OTHER): Payer: Self-pay | Admitting: *Deleted

## 2014-07-14 NOTE — Telephone Encounter (Signed)
Mitchell Ross was returning [name]'s call. He can be reached at [phone].

## 2014-07-25 NOTE — Telephone Encounter (Signed)
I have tried calling patient.  He is not answering.

## 2014-08-10 NOTE — Telephone Encounter (Signed)
I have left him a message at home.

## 2015-06-25 ENCOUNTER — Ambulatory Visit: Payer: Self-pay

## 2015-06-25 ENCOUNTER — Ambulatory Visit (INDEPENDENT_AMBULATORY_CARE_PROVIDER_SITE_OTHER): Payer: BLUE CROSS/BLUE SHIELD | Admitting: Podiatry

## 2015-06-25 DIAGNOSIS — M779 Enthesopathy, unspecified: Secondary | ICD-10-CM

## 2015-06-25 DIAGNOSIS — M79675 Pain in left toe(s): Secondary | ICD-10-CM | POA: Diagnosis not present

## 2015-06-25 DIAGNOSIS — M79674 Pain in right toe(s): Secondary | ICD-10-CM

## 2015-06-25 DIAGNOSIS — L84 Corns and callosities: Secondary | ICD-10-CM | POA: Diagnosis not present

## 2015-06-25 DIAGNOSIS — M2040 Other hammer toe(s) (acquired), unspecified foot: Secondary | ICD-10-CM

## 2015-06-26 NOTE — Progress Notes (Signed)
Subjective:     Patient ID: Mitchell Ross, male   DOB: September 14, 1955, 60 y.o.   MRN: [id number]  HPI patient presents with forefoot problems with severe pain around the third digit bilateral flatfeet hammertoe deformity and history of traumatic fracture of his back requiring fusion in the last several years. States the third toes are making it increasingly hard to work   Review of Systems  All other systems reviewed and are negative.      Objective:   Physical Exam  Constitutional: He is oriented to person, place, and time.  Cardiovascular: Intact distal pulses.   Musculoskeletal: Normal range of motion.  Neurological: He is oriented to person, place, and time.  Skin: Skin is warm.  Nursing note and vitals reviewed.  Neurovascular status intact muscle strength adequate range of motion within normal limits with patient found to have significant digital deformity of the lesser digits left and right with distal keratotic lesion digit 3 bilateral that are painful when pressed and flatfoot deformity noted bilateral. There may be some muscle strength loss secondary to the previous injury that this patient experiences and I did note good digital perfusion and patient is well oriented 3     Assessment:     Damage to the feet bilateral secondary to previous injury with hammertoe deformity and distal keratotic lesions digits 3 bilateral that are painful when pressed with flatfoot deformity noted    Plan:     H&P and x-rays reviewed with patient. Today debridement accomplished bilateral with buttress pad applications bilateral and scanned for custom orthotics to try to support the arch and take stress off the digits. May ultimately require digital procedures if symptoms persist through conservative care  X-ray report indicates significant digital contracture lesser digits with significant flatfoot deformity bilateral

## 2015-07-19 ENCOUNTER — Ambulatory Visit: Payer: BLUE CROSS/BLUE SHIELD | Admitting: Podiatry

## 2015-07-23 ENCOUNTER — Ambulatory Visit (INDEPENDENT_AMBULATORY_CARE_PROVIDER_SITE_OTHER): Payer: BLUE CROSS/BLUE SHIELD | Admitting: Podiatry

## 2015-07-23 DIAGNOSIS — M779 Enthesopathy, unspecified: Secondary | ICD-10-CM | POA: Diagnosis not present

## 2015-07-23 DIAGNOSIS — M2040 Other hammer toe(s) (acquired), unspecified foot: Secondary | ICD-10-CM | POA: Diagnosis not present

## 2015-07-23 DIAGNOSIS — L84 Corns and callosities: Secondary | ICD-10-CM | POA: Diagnosis not present

## 2015-07-23 NOTE — Patient Instructions (Signed)

## 2015-07-24 NOTE — Progress Notes (Signed)
Subjective:     Patient ID: Mitchell Ross, male   DOB: 07/30/1955, 60 y.o.   MRN: [id number]  HPI patient states my feet are feeling little better but I still get pain with these toes   Review of Systems     Objective:   Physical Exam Neurovascular status intact with continued contracture digits with distal keratotic lesion formation still noted    Assessment:     Hammertoe deformity with structural changes of the underlying bone structure    Plan:     Explained the patient condition and recommended at this time that we utilize orthotics trimming and continued padding. May require digital fusion which I explained to patient today

## 2015-08-02 DIAGNOSIS — M069 Rheumatoid arthritis, unspecified: Secondary | ICD-10-CM | POA: Diagnosis not present

## 2015-10-16 ENCOUNTER — Telehealth: Payer: Self-pay

## 2015-10-16 NOTE — Telephone Encounter (Signed)
Patient on recall for tcs °

## 2015-10-17 NOTE — Telephone Encounter (Signed)
Letter mailed and my chart message sent

## 2015-11-02 DIAGNOSIS — Z6833 Body mass index (BMI) 33.0-33.9, adult: Secondary | ICD-10-CM | POA: Diagnosis not present

## 2015-11-02 DIAGNOSIS — I10 Essential (primary) hypertension: Secondary | ICD-10-CM | POA: Diagnosis not present

## 2015-11-02 DIAGNOSIS — Z1389 Encounter for screening for other disorder: Secondary | ICD-10-CM | POA: Diagnosis not present

## 2015-11-02 DIAGNOSIS — G894 Chronic pain syndrome: Secondary | ICD-10-CM | POA: Diagnosis not present

## 2015-11-02 DIAGNOSIS — N400 Enlarged prostate without lower urinary tract symptoms: Secondary | ICD-10-CM | POA: Diagnosis not present

## 2015-12-25 ENCOUNTER — Telehealth: Payer: Self-pay

## 2015-12-25 NOTE — Telephone Encounter (Signed)
Pt received a triage letter. It's been 10 years since his last colonoscopy.Please call [phone]

## 2016-01-02 NOTE — Telephone Encounter (Signed)
LMOM to call.

## 2016-01-14 NOTE — Telephone Encounter (Signed)
LMOM to call.

## 2016-01-15 ENCOUNTER — Telehealth: Payer: Self-pay

## 2016-01-15 NOTE — Telephone Encounter (Signed)
Letter mailed for pt to call in the afternoon if possible.

## 2016-01-15 NOTE — Telephone Encounter (Signed)
LMOM to call.

## 2016-01-16 NOTE — Telephone Encounter (Signed)
PT wants December appt.

## 2016-01-28 ENCOUNTER — Telehealth: Payer: Self-pay

## 2016-01-28 NOTE — Telephone Encounter (Signed)
Gastroenterology Pre-Procedure Review  Request Date: 01/15/2016 Requesting Physician:   PATIENT REVIEW QUESTIONS: The patient responded to the following health history questions as indicated:    1. Diabetes Melitis: no 2. Joint replacements in the past 12 months: no 3. Major health problems in the past 3 months: no 4. Has an artificial valve or MVP: no 5. Has a defibrillator: no 6. Has been advised in past to take antibiotics in advance of a procedure like teeth cleaning: no 7. Family history of colon cancer: no  8. Alcohol Use: 6 pak weekly 9. History of sleep apnea: no  10. History of coronary artery or other vascular stents placed within the last 12 months: no    MEDICATIONS & ALLERGIES:    Patient reports the following regarding taking any blood thinners:   Plavix? no  Aspirin?  YES Coumadin? no Brilinta? no Xarelto? no Eliquis? no Pradaxa? no Savaysa? no Effient? no  Patient confirms/reports the following medications:  Current Outpatient Prescriptions  Medication Sig Dispense Refill  . aspirin 325 MG tablet Take 325 mg by mouth daily.     . Lycopene 10 MG CAPS Take 10 mg by mouth daily.    . Multiple Vitamins-Minerals (CENTRUM SILVER PO) Take 1 tablet by mouth daily.    Marland Kitchen oxyCODONE-acetaminophen (PERCOCET) 10-325 MG per tablet Take 1 tablet by mouth every 4 (four) hours as needed for pain.    . pravastatin (PRAVACHOL) 20 MG tablet Take 20 mg by mouth at bedtime.     . Saw Palmetto, Serenoa repens, (SAW PALMETTO PO) Take 1,800 mg by mouth daily.    . Tetrahydrozoline HCl (VISINE OP) Place 2 drops into both eyes daily as needed (dry eyes).    . Ledipasvir-Sofosbuvir (HARVONI) 90-400 MG TABS Take by mouth.    . meloxicam (MOBIC) 15 MG tablet Take by mouth daily.    . Misc Natural Products (COLON CLEANSE) CAPS Take 1 capsule by mouth daily.     No current facility-administered medications for this visit.     Patient confirms/reports the following allergies:  No Known  Allergies  No orders of the defined types were placed in this encounter.   AUTHORIZATION INFORMATION Primary Insurance:  ID #:  Group #:  Pre-Cert / Auth required:  Pre-Cert / Auth #:   Secondary Insurance:  ID #:   Group #:  Pre-Cert / Auth required:  Pre-Cert / Auth #:   SCHEDULE INFORMATION: Procedure has been scheduled as follows:  Date: 02/18/2016               Time: 9:30 AM  Location: [facility] Short Stay  This Gastroenterology Pre-Precedure Review Form is being routed to the following provider(s): Barney Drain, MD

## 2016-01-30 NOTE — Telephone Encounter (Signed)
Pt is trying to change his appt to 03/03/2016. He will call me Monday.

## 2016-01-30 NOTE — Telephone Encounter (Signed)
REVIEWED. IF IT'S IN EPIC NO NEED TO SCAN AGAIN. DID NOT SHOW UP UNDER NOTES WHEN GASTROENTEROLOGY WA CHECKED.

## 2016-01-30 NOTE — Telephone Encounter (Signed)
The 2007 colonoscopy is in epic. Look under notes and go to 07/30/2010 operative report. I have printed report off and will send to scanning where it'll be under the media tab.

## 2016-01-30 NOTE — Telephone Encounter (Signed)
OBTAIN TCS REPORT FROM 2007 AND SCAN IN SYSTEM.  MOVI PREP SPLIT DOSING, FULL LIQUIDS WITH BREAKFAST.  Full Liquid Diet A high-calorie, high-protein supplement should be used to meet your nutritional requirements when the full liquid diet is continued for more than 2 or 3 days. If this diet is to be used for an extended period of time (more than 7 days), a multivitamin should be considered.  Breads and Starches  Allowed: None are allowed   Avoid: Any others.    Potatoes/Pasta/Rice  Allowed: ANY ITEM AS A SOUP OR SMALL PLATE OF MASHED POTATOES OR SCRAMBLED EGGS(NO MORE THAN ONE TIME THE DAY PRIOR TO YOUR COLONOSCOPY).       Vegetables  Allowed: Strained tomato or vegetable juice. Vegetables pureed in soup.   Avoid: Any others.    Fruit  Allowed: Any strained fruit juices and fruit drinks. Include 1 serving of citrus or vitamin C-enriched fruit juice daily.   Avoid: Any others.  Meat and Meat Substitutes  Allowed: Egg  Avoid: Any meat, fish, or fowl. All cheese.  Milk  Allowed: SOY Milk beverages, including milk shakes and instant breakfast mixes. Smooth yogurt.   Avoid: Any others. Avoid dairy products if not tolerated.    Soups and Combination Foods  Allowed: Broth, strained cream soups. Strained, broth-based soups.   Avoid: Any others.    Desserts and Sweets  Allowed: flavored gelatin, tapioca, ice cream, sherbet, smooth pudding, junket, fruit ices, frozen ice pops, pudding pops, frozen fudge pops, chocolate syrup. Sugar, honey, jelly, syrup.   Avoid: Any others.  Fats and Oils  Allowed: Margarine, butter, cream, sour cream, oils.   Avoid: Any others.  Beverages  Allowed: All.   Avoid: None.  Condiments  Allowed: Iodized salt, pepper, spices, flavorings. Cocoa powder.   Avoid: Any others.    SAMPLE MEAL PLAN Breakfast   cup orange juice.   1 OR 2 EGGS  1 cup milk.   1 cup beverage (coffee or tea).   Cream or sugar, if desired.     Midmorning Snack  2 SCRAMBLED OR HARD BOILED EGG   Lunch  1 cup cream soup.    cup fruit juice.   1 cup milk.    cup custard.   1 cup beverage (coffee or tea).   Cream or sugar, if desired.    Midafternoon Snack  1 cup milk shake.  Dinner  1 cup cream soup.    cup fruit juice.   1 cup MILK    cup pudding.   1 cup beverage (coffee or tea).   Cream or sugar, if desired.  Evening Snack  1 cup supplement.  To increase calories, add sugar, cream, butter, or margarine if possible. Nutritional supplements will also increase the total calories.

## 2016-01-31 NOTE — Telephone Encounter (Signed)
See separate triage.  

## 2016-02-04 ENCOUNTER — Other Ambulatory Visit: Payer: Self-pay

## 2016-02-04 DIAGNOSIS — Z1211 Encounter for screening for malignant neoplasm of colon: Secondary | ICD-10-CM

## 2016-02-04 MED ORDER — PEG-KCL-NACL-NASULF-NA ASC-C 100 G PO SOLR: 1.0000 | ORAL | 0 refills | Status: DC

## 2016-02-04 NOTE — Telephone Encounter (Signed)
Pt called and confirmed he can have the appt on 03/03/2016 at 8:30 am.

## 2016-02-04 NOTE — Addendum Note (Signed)
Addended by: Everardo All on: 02/04/2016 10:28 AM   Modules accepted: Orders

## 2016-02-04 NOTE — Telephone Encounter (Signed)
Rx sent to the pharmacy and instructions mailed to pt.  

## 2016-02-14 ENCOUNTER — Telehealth: Payer: Self-pay

## 2016-02-14 NOTE — Telephone Encounter (Signed)
LMOM for a return call to update meds for triage.

## 2016-02-21 NOTE — Telephone Encounter (Signed)
Pt left Vm there has been no change in his meds.

## 2016-02-27 ENCOUNTER — Telehealth: Payer: Self-pay

## 2016-02-27 DIAGNOSIS — N401 Enlarged prostate with lower urinary tract symptoms: Secondary | ICD-10-CM | POA: Diagnosis not present

## 2016-02-27 NOTE — Telephone Encounter (Signed)
I called [employer] @ [phone] and spoke to Pam Drown who said PA is not required for screening colonoscopy.  Ref # [id number].

## 2016-02-29 ENCOUNTER — Encounter (HOSPITAL_COMMUNITY): Payer: Self-pay | Admitting: *Deleted

## 2016-03-03 ENCOUNTER — Encounter (HOSPITAL_COMMUNITY)
Admission: RE | Disposition: A | Discharge status: Home / Self Care | Payer: Self-pay | Source: Ambulatory Visit | Attending: Gastroenterology

## 2016-03-03 ENCOUNTER — Encounter (HOSPITAL_COMMUNITY): Payer: Self-pay | Admitting: *Deleted

## 2016-03-03 ENCOUNTER — Ambulatory Visit (HOSPITAL_COMMUNITY)
Admission: RE | Admit: 2016-03-03 | Discharge: 2016-03-03 | Disposition: A | Discharge status: Home / Self Care | Payer: BLUE CROSS/BLUE SHIELD | Source: Ambulatory Visit | Attending: Gastroenterology | Admitting: Gastroenterology

## 2016-03-03 DIAGNOSIS — Z79899 Other long term (current) drug therapy: Secondary | ICD-10-CM | POA: Diagnosis not present

## 2016-03-03 DIAGNOSIS — Z1212 Encounter for screening for malignant neoplasm of rectum: Secondary | ICD-10-CM

## 2016-03-03 DIAGNOSIS — D123 Benign neoplasm of transverse colon: Secondary | ICD-10-CM | POA: Insufficient documentation

## 2016-03-03 DIAGNOSIS — E7800 Pure hypercholesterolemia, unspecified: Secondary | ICD-10-CM | POA: Insufficient documentation

## 2016-03-03 DIAGNOSIS — D128 Benign neoplasm of rectum: Secondary | ICD-10-CM | POA: Diagnosis not present

## 2016-03-03 DIAGNOSIS — Z1211 Encounter for screening for malignant neoplasm of colon: Secondary | ICD-10-CM | POA: Diagnosis not present

## 2016-03-03 DIAGNOSIS — Q438 Other specified congenital malformations of intestine: Secondary | ICD-10-CM | POA: Diagnosis not present

## 2016-03-03 DIAGNOSIS — K5730 Diverticulosis of large intestine without perforation or abscess without bleeding: Secondary | ICD-10-CM | POA: Diagnosis not present

## 2016-03-03 DIAGNOSIS — F17210 Nicotine dependence, cigarettes, uncomplicated: Secondary | ICD-10-CM | POA: Diagnosis not present

## 2016-03-03 DIAGNOSIS — D122 Benign neoplasm of ascending colon: Secondary | ICD-10-CM | POA: Insufficient documentation

## 2016-03-03 DIAGNOSIS — M1990 Unspecified osteoarthritis, unspecified site: Secondary | ICD-10-CM | POA: Diagnosis not present

## 2016-03-03 DIAGNOSIS — D125 Benign neoplasm of sigmoid colon: Secondary | ICD-10-CM | POA: Diagnosis not present

## 2016-03-03 DIAGNOSIS — K644 Residual hemorrhoidal skin tags: Secondary | ICD-10-CM | POA: Insufficient documentation

## 2016-03-03 DIAGNOSIS — B1920 Unspecified viral hepatitis C without hepatic coma: Secondary | ICD-10-CM | POA: Diagnosis not present

## 2016-03-03 DIAGNOSIS — K621 Rectal polyp: Secondary | ICD-10-CM | POA: Diagnosis not present

## 2016-03-03 DIAGNOSIS — Z7982 Long term (current) use of aspirin: Secondary | ICD-10-CM | POA: Diagnosis not present

## 2016-03-03 DIAGNOSIS — K635 Polyp of colon: Secondary | ICD-10-CM | POA: Diagnosis not present

## 2016-03-03 HISTORY — PX: COLONOSCOPY: SHX5424

## 2016-03-03 HISTORY — PX: BIOPSY: SHX5522

## 2016-03-03 HISTORY — PX: POLYPECTOMY: SHX5525

## 2016-03-03 SURGERY — COLONOSCOPY
Anesthesia: Moderate Sedation

## 2016-03-03 MED ORDER — MEPERIDINE HCL 100 MG/ML IJ SOLN
INTRAMUSCULAR | Status: AC
  Filled 2016-03-03: qty 2

## 2016-03-03 MED ORDER — MIDAZOLAM HCL 5 MG/5ML IJ SOLN
INTRAMUSCULAR | Status: AC
  Filled 2016-03-03: qty 10

## 2016-03-03 MED ORDER — MEPERIDINE HCL 100 MG/ML IJ SOLN
INTRAMUSCULAR | Status: DC | PRN
  Administered 2016-03-03: 25 mg via INTRAVENOUS
  Administered 2016-03-03: 50 mg via INTRAVENOUS
  Administered 2016-03-03 (×2): 25 mg via INTRAVENOUS

## 2016-03-03 MED ORDER — SODIUM CHLORIDE 0.9 % IV SOLN
INTRAVENOUS | Status: DC
  Administered 2016-03-03: 08:00:00 via INTRAVENOUS

## 2016-03-03 MED ORDER — MIDAZOLAM HCL 5 MG/5ML IJ SOLN
INTRAMUSCULAR | Status: DC | PRN
  Administered 2016-03-03 (×4): 2 mg via INTRAVENOUS

## 2016-03-03 NOTE — Discharge Instructions (Signed)
You had 6 polyps removed. .ONE WAS LARGER THAN A CM. YOU HAVE DIVERTICULOSIS IN YOUR RIGHT AND LEFT COLON AND LARGE EXTERNAL HEMORRHOIDS hemorrhoids.    CONTINUE YOUR WEIGHT LOSS EFFORTS. LOSE TEN POUNDS.  DRINK WATER TO KEEP YOUR URINE LIGHT YELLOW.  FOLLOW A HIGH FIBER DIET. AVOID ITEMS THAT CAUSE BLOATING & GAS. SEE INFO BELOW.  YOUR BIOPSY RESULTS WILL BE AVAILABLE IN MY CHART AFTER DEC 21 AND MY OFFICE WILL CONTACT YOU IN 10-14 DAYS WITH YOUR RESULTS.   Next colonoscopy in 1-3 years.    Colonoscopy Care After Read the instructions outlined below and refer to this sheet in the next week. These discharge instructions provide you with general information on caring for yourself after you leave the hospital. While your treatment has been planned according to the most current medical practices available, unavoidable complications occasionally occur. If you have any problems or questions after discharge, call DR. Syleena Mchan, [phone].  ACTIVITY  You may resume your regular activity, but move at a slower pace for the next 24 hours.   Take frequent rest periods for the next 24 hours.   Walking will help get rid of the air and reduce the bloated feeling in your belly (abdomen).   No driving for 24 hours (because of the medicine (anesthesia) used during the test).   You may shower.   Do not sign any important legal documents or operate any machinery for 24 hours (because of the anesthesia used during the test).    NUTRITION  Drink plenty of fluids.   You may resume your normal diet as instructed by your doctor.   Begin with a light meal and progress to your normal diet. Heavy or fried foods are harder to digest and may make you feel sick to your stomach (nauseated).   Avoid alcoholic beverages for 24 hours or as instructed.    MEDICATIONS  You may resume your normal medications.   WHAT YOU CAN EXPECT TODAY  Some feelings of bloating in the abdomen.   Passage of more  gas than usual.   Spotting of blood in your stool or on the toilet paper  .  IF YOU HAD POLYPS REMOVED DURING THE COLONOSCOPY:  Eat a soft diet IF YOU HAVE NAUSEA, BLOATING, ABDOMINAL PAIN, OR VOMITING.    FINDING OUT THE RESULTS OF YOUR TEST Not all test results are available during your visit. DR. Oneida Alar WILL CALL YOU WITHIN 14 DAYS OF YOUR PROCEDUE WITH YOUR RESULTS. Do not assume everything is normal if you have not heard from DR. Graciemae Delisle, CALL HER OFFICE AT [phone].  SEEK IMMEDIATE MEDICAL ATTENTION AND CALL THE OFFICE: [phone] IF:  You have more than a spotting of blood in your stool.   Your belly is swollen (abdominal distention).   You are nauseated or vomiting.   You have a temperature over 101F.   You have abdominal pain or discomfort that is severe or gets worse throughout the day.   High-Fiber Diet A high-fiber diet changes your normal diet to include more whole grains, legumes, fruits, and vegetables. Changes in the diet involve replacing refined carbohydrates with unrefined foods. The calorie level of the diet is essentially unchanged. The Dietary Reference Intake (recommended amount) for adult males is 38 grams per day. For adult females, it is 25 grams per day. Pregnant and lactating women should consume 28 grams of fiber per day. Fiber is the intact part of a plant that is not broken down during digestion. Functional fiber  is fiber that has been isolated from the plant to provide a beneficial effect in the body. PURPOSE  Increase stool bulk.   Ease and regulate bowel movements.   Lower cholesterol.   REDUCE RISK OF COLON CANCER  INDICATIONS THAT YOU NEED MORE FIBER  Constipation and hemorrhoids.   Uncomplicated diverticulosis (intestine condition) and irritable bowel syndrome.   Weight management.   As a protective measure against hardening of the arteries (atherosclerosis), diabetes, and cancer.   GUIDELINES FOR INCREASING FIBER IN THE  DIET  Start adding fiber to the diet slowly. A gradual increase of about 5 more grams (2 slices of whole-wheat bread, 2 servings of most fruits or vegetables, or 1 bowl of high-fiber cereal) per day is best. Too rapid an increase in fiber may result in constipation, flatulence, and bloating.   Drink enough water and fluids to keep your urine clear or pale yellow. Water, juice, or caffeine-free drinks are recommended. Not drinking enough fluid may cause constipation.   Eat a variety of high-fiber foods rather than one type of fiber.   Try to increase your intake of fiber through using high-fiber foods rather than fiber pills or supplements that contain small amounts of fiber.   The goal is to change the types of food eaten. Do not supplement your present diet with high-fiber foods, but replace foods in your present diet.   INCLUDE A VARIETY OF FIBER SOURCES  Replace refined and processed grains with whole grains, canned fruits with fresh fruits, and incorporate other fiber sources. White rice, white breads, and most bakery goods contain little or no fiber.   Brown whole-grain rice, buckwheat oats, and many fruits and vegetables are all good sources of fiber. These include: broccoli, Brussels sprouts, cabbage, cauliflower, beets, sweet potatoes, white potatoes (skin on), carrots, tomatoes, eggplant, squash, berries, fresh fruits, and dried fruits.   Cereals appear to be the richest source of fiber. Cereal fiber is found in whole grains and bran. Bran is the fiber-rich outer coat of cereal grain, which is largely removed in refining. In whole-grain cereals, the bran remains. In breakfast cereals, the largest amount of fiber is found in those with "bran" in their names. The fiber content is sometimes indicated on the label.   You may need to include additional fruits and vegetables each day.   In baking, for 1 cup white flour, you may use the following substitutions:   1 cup whole-wheat flour  minus 2 tablespoons.   1/2 cup white flour plus 1/2 cup whole-wheat flour.   Polyps, Colon  A polyp is extra tissue that grows inside your body. Colon polyps grow in the large intestine. The large intestine, also called the colon, is part of your digestive system. It is a long, hollow tube at the end of your digestive tract where your body makes and stores stool. Most polyps are not dangerous. They are benign. This means they are not cancerous. But over time, some types of polyps can turn into cancer. Polyps that are smaller than a pea are usually not harmful. But larger polyps could someday become or may already be cancerous. To be safe, doctors remove all polyps and test them.   WHO GETS POLYPS? Anyone can get polyps, but certain people are more likely than others. You may have a greater chance of getting polyps if:  You are over 50.   You have had polyps before.   Someone in your family has had polyps.   Someone in your  family has had cancer of the large intestine.   Find out if someone in your family has had polyps. You may also be more likely to get polyps if you:   Eat a lot of fatty foods   Smoke   Drink alcohol   Do not exercise  Eat too much   PREVENTION There is not one sure way to prevent polyps. You might be able to lower your risk of getting them if you:  Eat more fruits and vegetables and less fatty food.   Do not smoke.   Avoid alcohol.   Exercise every day.   Lose weight if you are overweight.   Eating more calcium and folate can also lower your risk of getting polyps. Some foods that are rich in calcium are milk, cheese, and broccoli. Some foods that are rich in folate are chickpeas, kidney beans, and spinach.    Diverticulosis Diverticulosis is a common condition that develops when small pouches (diverticula) form in the wall of the colon. The risk of diverticulosis increases with age. It happens more often in people who eat a low-fiber diet. Most  individuals with diverticulosis have no symptoms. Those individuals with symptoms usually experience belly (abdominal) pain, constipation, or loose stools (diarrhea).  HOME CARE INSTRUCTIONS  Increase the amount of fiber in your diet as directed by your caregiver or dietician. This may reduce symptoms of diverticulosis.   Drink at least 6 to 8 glasses of water each day to prevent constipation.   Try not to strain when you have a bowel movement.   Avoiding nuts and seeds to prevent complications is NOT NECESSARY.    FOODS HAVING HIGH FIBER CONTENT INCLUDE:  Fruits. Apple, peach, pear, tangerine, raisins, prunes.   Vegetables. Brussels sprouts, asparagus, broccoli, cabbage, carrot, cauliflower, romaine lettuce, spinach, summer squash, tomato, winter squash, zucchini.   Starchy Vegetables. Baked beans, kidney beans, lima beans, split peas, lentils, potatoes (with skin).   Grains. Whole wheat bread, brown rice, bran flake cereal, plain oatmeal, white rice, shredded wheat, bran muffins.   SEEK IMMEDIATE MEDICAL CARE IF:  You develop increasing pain or severe bloating.   You have an oral temperature above 101F.   You develop vomiting or bowel movements that are bloody or black.   Hemorrhoids Hemorrhoids are dilated (enlarged) veins around the rectum. Sometimes clots will form in the veins. This makes them swollen and painful. These are called thrombosed hemorrhoids. Causes of hemorrhoids include:  Constipation.   Straining to have a bowel movement.   HEAVY LIFTING  HOME CARE INSTRUCTIONS  Eat a well balanced diet and drink 6 to 8 glasses of water every day to avoid constipation. You may also use a bulk laxative.   Avoid straining to have bowel movements.   Keep anal area dry and clean.   Do not use a donut shaped pillow or sit on the toilet for long periods. This increases blood pooling and pain.   Move your bowels when your body has the urge; this will require less  straining and will decrease pain and pressure.

## 2016-03-03 NOTE — Op Note (Signed)
[facility] Patient Name: Mitchell Ross Procedure Date: 03/03/2016 8:14 AM MRN: [id number] Date of Birth: 13-Oct-1955 Attending MD: Barney Drain , MD CSN: [id number] Age: 60 Admit Type: Outpatient Procedure:                Colonoscopy WITH SNARE/COLD FORCEPS POLYPECTOMY Indications:              Screening for colorectal malignant neoplasm Providers:                Barney Drain, MD, Rosina Lowenstein, RN, Isabella Stalling,                            Technician Referring MD:             Redmond School, MD Medicines:                Meperidine 100 mg IV, Midazolam 9 mg IV Complications:            No immediate complications. Estimated Blood Loss:     Estimated blood loss was minimal. Procedure:                Pre-Anesthesia Assessment:                           - Prior to the procedure, a History and Physical                            was performed, and patient medications and                            allergies were reviewed. The patient's tolerance of                            previous anesthesia was also reviewed. The risks                            and benefits of the procedure and the sedation                            options and risks were discussed with the patient.                            All questions were answered, and informed consent                            was obtained. Prior Anticoagulants: The patient has                            taken aspirin. ASA Grade Assessment: II - A patient                            with mild systemic disease. After reviewing the                            risks and benefits, the patient was deemed in  satisfactory condition to undergo the procedure.                           After obtaining informed consent, the colonoscope                            was passed under direct vision. Throughout the                            procedure, the patient's blood pressure, pulse, and                            oxygen  saturations were monitored continuously. The                            [id number] scope was introduced through                            the anus and advanced to the the cecum, identified                            by appendiceal orifice and ileocecal valve. The                            colonoscopy was somewhat difficult due to a                            tortuous colon. Successful completion of the                            procedure was aided by applying abdominal pressure                            and COLOWRAP. The patient tolerated the procedure                            fairly well. The quality of the bowel preparation                            was excellent. The ileocecal valve, appendiceal                            orifice, and rectum were photographed. Scope In: 8:57:57 AM Scope Out: 9:29:41 AM Scope Withdrawal Time: 0 hours 25 minutes 41 seconds  Total Procedure Duration: 0 hours 31 minutes 44 seconds  Findings:      Five sessile polyps were found in the rectum, sigmoid colon(1), proximal       transverse colon(1 > 1 CM), AND distal transverse colon(2). The polyps       were 6 to 12 mm in size. These polyps were removed with a hot snare.       Resection and retrieval were complete.      A 4 mm polyp was found in the proximal ascending colon. The polyp was       sessile. The polyp  was removed with a cold biopsy forceps. Resection and       retrieval were complete.      Many small and large-mouthed diverticula were found in the entire colon.       There was no evidence of diverticular bleeding.      External hemorrhoids were found. The hemorrhoids were large. Impression:               - SIX COLORECTAL Polyps REMOVED                           - Moderate diverticulosis in the entire examined                            colon. There was no evidence of diverticular                            bleeding.                           - LARGE External hemorrhoids. Moderate  Sedation:      Moderate (conscious) sedation was administered by the endoscopy nurse       and supervised by the endoscopist. The following parameters were       monitored: oxygen saturation, heart rate, blood pressure, and response       to care. Total physician intraservice time was 58 minutes. Recommendation:           - High fiber diet.                           - Continue present medications.                           - Await pathology results.                           - Repeat colonoscopy 1-3 YEARS for surveillance.                           - Patient has a contact number available for                            emergencies. The signs and symptoms of potential                            delayed complications were discussed with the                            patient. Return to normal activities tomorrow.                            Written discharge instructions were provided to the                            patient. Procedure Code(s):        --- Professional ---  [phone], Colonoscopy, flexible; with removal of                            tumor(s), polyp(s), or other lesion(s) by snare                            technique                           45380, 59, Colonoscopy, flexible; with biopsy,                            single or multiple                           99152, Moderate sedation services provided by the                            same physician or other qualified health care                            professional performing the diagnostic or                            therapeutic service that the sedation supports,                            requiring the presence of an independent trained                            observer to assist in the monitoring of the                            patient's level of consciousness and physiological                            status; initial 15 minutes of intraservice time,                            patient age  48 years or older                           99153, Moderate sedation services; each additional                            15 minutes intraservice time                           99153, Moderate sedation services; each additional                            15 minutes intraservice time                           99153, Moderate sedation services; each additional  15 minutes intraservice time Diagnosis Code(s):        --- Professional ---                           Z12.11, Encounter for screening for malignant                            neoplasm of colon                           K62.1, Rectal polyp                           D12.5, Benign neoplasm of sigmoid colon                           D12.3, Benign neoplasm of transverse colon (hepatic                            flexure or splenic flexure)                           D12.2, Benign neoplasm of ascending colon                           K64.4, Residual hemorrhoidal skin tags                           K57.30, Diverticulosis of large intestine without                            perforation or abscess without bleeding CPT copyright 2016 American Medical Association. All rights reserved. The codes documented in this report are preliminary and upon coder review may  be revised to meet current compliance requirements. Barney Drain, MD Barney Drain, MD 03/03/2016 10:03:52 AM This report has been signed electronically. Number of Addenda: 0

## 2016-03-03 NOTE — H&P (Signed)
Primary Care Physician:  Glo Herring., MD Primary Gastroenterologist:  Dr. Oneida Alar  Pre-Procedure History & Physical: HPI:  Mitchell Ross is a 60 y.o. male here for COLON CANCER SCREENING.  Past Medical History:  Diagnosis Date  . Arthritis   . Concussion with brief LOC 05/27/12  . Hepatitis C   . High cholesterol   . History of prostate biopsy    states it was negative  . Neck injury 05/27/12    Past Surgical History:  Procedure Laterality Date  . ANTERIOR CERVICAL DECOMP/DISCECTOMY FUSION N/A 01/20/2013   Procedure: ANTERIOR CERVICAL DECOMPRESSION/DISCECTOMY FUSION 1 LEVEL;  Surgeon: Erline Levine, MD;  Location: [facility];  Service: Neurosurgery;  Laterality: N/A;  C3-4 Anterior cervical decompression/diskectomy/fusion  . COLONOSCOPY    . FINGER SURGERY     tip of finger amputation  . torn right knee cartiledge  1970's    Prior to Admission medications   Medication Sig Start Date End Date Taking? Authorizing Provider  aspirin 325 MG tablet Take 325 mg by mouth daily.    Yes Historical Provider, MD  Lycopene 10 MG CAPS Take 10 mg by mouth daily.   Yes Historical Provider, MD  meloxicam (MOBIC) 15 MG tablet Take 15 mg by mouth daily.    Yes Historical Provider, MD  Multiple Vitamins-Minerals (CENTRUM SILVER PO) Take 1 tablet by mouth daily.   Yes Historical Provider, MD  oxyCODONE-acetaminophen (PERCOCET) 10-325 MG per tablet Take 1 tablet by mouth every 4 (four) hours as needed for pain.   Yes Historical Provider, MD  pravastatin (PRAVACHOL) 20 MG tablet Take 20 mg by mouth at bedtime.    Yes Historical Provider, MD  Saw Palmetto, Serenoa repens, (SAW PALMETTO PO) Take 1,800 mg by mouth daily.   Yes Historical Provider, MD  Tetrahydrozoline HCl (VISINE OP) Place 2 drops into both eyes daily as needed (dry eyes).   Yes Historical Provider, MD    Allergies as of 02/04/2016  . (No Known Allergies)    Family History  Problem Relation Age of Onset  . Cancer    .  Arthritis    . Colon cancer Neg Hx     Social History   Social History  . Marital status: Divorced    Spouse name: N/A  . Number of children: N/A  . Years of education: N/A   Occupational History  . Not on file.   Social History Main Topics  . Smoking status: Current Every Day Smoker    Packs/day: 0.50    Years: 30.00    Types: Cigarettes  . Smokeless tobacco: Never Used     Comment: 1/2 pack a day  . Alcohol use 7.2 oz/week    12 Cans of beer per week  . Drug use: No  . Sexual activity: Not on file   Other Topics Concern  . Not on file   Social History Narrative  . No narrative on file    Review of Systems: See HPI, otherwise negative ROS   Physical Exam: BP 127/88   Pulse 67   Temp 97.9 F (36.6 C) (Oral)   Resp 18   Ht 6\' 4"  (1.93 m)   Wt 230 lb (104.3 kg)   SpO2 98%   BMI 28.00 kg/m  General:   Alert,  pleasant and cooperative in NAD Head:  Normocephalic and atraumatic. Neck:  Supple; Lungs:  Clear throughout to auscultation.    Heart:  Regular rate and rhythm. Abdomen:  Soft, nontender and nondistended. Normal  bowel sounds, without guarding, and without rebound.   Neurologic:  Alert and  oriented x4;  grossly normal neurologically.  Impression/Plan:     SCREENING  Plan:  1. TCS TODAY. DISCUSSED PROCEDURE, BENEFITS, & RISKS: < 1% chance of medication reaction, bleeding, perforation, or rupture of spleen/liver.

## 2016-03-03 NOTE — Progress Notes (Signed)
Patient states he "no longer takes Mobic"

## 2016-03-04 DIAGNOSIS — R972 Elevated prostate specific antigen [PSA]: Secondary | ICD-10-CM | POA: Diagnosis not present

## 2016-03-18 ENCOUNTER — Encounter (HOSPITAL_COMMUNITY): Payer: Self-pay | Admitting: Gastroenterology

## 2016-03-18 DIAGNOSIS — Z1389 Encounter for screening for other disorder: Secondary | ICD-10-CM | POA: Diagnosis not present

## 2016-03-18 DIAGNOSIS — M2550 Pain in unspecified joint: Secondary | ICD-10-CM | POA: Diagnosis not present

## 2016-03-18 DIAGNOSIS — F1120 Opioid dependence, uncomplicated: Secondary | ICD-10-CM | POA: Diagnosis not present

## 2016-03-18 DIAGNOSIS — I10 Essential (primary) hypertension: Secondary | ICD-10-CM | POA: Diagnosis not present

## 2016-03-18 DIAGNOSIS — Z6832 Body mass index (BMI) 32.0-32.9, adult: Secondary | ICD-10-CM | POA: Diagnosis not present

## 2016-03-18 DIAGNOSIS — G894 Chronic pain syndrome: Secondary | ICD-10-CM | POA: Diagnosis not present

## 2016-05-13 ENCOUNTER — Ambulatory Visit (INDEPENDENT_AMBULATORY_CARE_PROVIDER_SITE_OTHER): Payer: BLUE CROSS/BLUE SHIELD | Admitting: Orthopedic Surgery

## 2016-05-13 ENCOUNTER — Ambulatory Visit (INDEPENDENT_AMBULATORY_CARE_PROVIDER_SITE_OTHER): Payer: BLUE CROSS/BLUE SHIELD

## 2016-05-13 ENCOUNTER — Encounter: Payer: Self-pay | Admitting: Orthopedic Surgery

## 2016-05-13 VITALS — BP 111/76 | HR 88 | Ht 75.0 in | Wt 221.0 lb

## 2016-05-13 DIAGNOSIS — M75101 Unspecified rotator cuff tear or rupture of right shoulder, not specified as traumatic: Secondary | ICD-10-CM

## 2016-05-13 DIAGNOSIS — M25511 Pain in right shoulder: Secondary | ICD-10-CM

## 2016-05-13 MED ORDER — HYDROCODONE-ACETAMINOPHEN 5-325 MG PO TABS: 1.0000 | ORAL_TABLET | Freq: Every day | ORAL | 0 refills | Status: DC

## 2016-05-13 MED ORDER — PREDNISONE 10 MG PO TABS: 10.0000 mg | ORAL_TABLET | Freq: Every day | ORAL | 0 refills | Status: DC

## 2016-05-13 NOTE — Patient Instructions (Signed)
You have received an injection of steroids into the joint. 15% of patients will have increased pain within the 24 hours postinjection.   This is transient and will go away.   We recommend that you use ice packs on the injection site for 20 minutes every 2 hours and extra strength Tylenol 2 tablets every 8 as needed until the pain resolves.  If you continue to have pain after taking the Tylenol and using the ice please call the office for further instructions.  Do the exercises daily for next 6 weeks   Take the prednisone in the morning and the pain pill if needed at night

## 2016-05-13 NOTE — Progress Notes (Signed)
Patient ID: Mitchell Ross, male   DOB: Jan 21, 1956, 61 y.o.   MRN: [id number]  Chief Complaint  Patient presents with  . Shoulder Pain    right shoulder pain    HPI Mitchell Ross is a 61 y.o. male.  Shoulder Pain: 61 year old male with a history of a left rotator cuff repair which was treated with injection the patient was obscured to have the surgery has some mild pain there but presents now with recent onset of right shoulder pain weakness decreased range of motion a constant dull ache in the right upper arm for the last month or so  No treatment worsening     Review of Systems Review of Systems Normal neuro  Denies fever   Past Medical History:  Diagnosis Date  . Arthritis   . Concussion with brief LOC 05/27/12  . Hepatitis C   . High cholesterol   . History of prostate biopsy    states it was negative  . Neck injury 05/27/12    Past Surgical History:  Procedure Laterality Date  . ANTERIOR CERVICAL DECOMP/DISCECTOMY FUSION N/A 01/20/2013   Procedure: ANTERIOR CERVICAL DECOMPRESSION/DISCECTOMY FUSION 1 LEVEL;  Surgeon: Erline Levine, MD;  Location: [facility];  Service: Neurosurgery;  Laterality: N/A;  C3-4 Anterior cervical decompression/diskectomy/fusion  . BIOPSY  03/03/2016   Procedure: BIOPSY;  Surgeon: Danie Binder, MD;  [facility];  Service: Endoscopy;;  polypectomy by biopsy at ascending colon  . COLONOSCOPY    . COLONOSCOPY N/A 03/03/2016   Procedure: COLONOSCOPY;  Surgeon: Danie Binder, MD;  Location: [facility];  Service: Endoscopy;  Laterality: N/A;  8:30 Am  . FINGER SURGERY     tip of finger amputation  . POLYPECTOMY  03/03/2016   Procedure: POLYPECTOMY;  Surgeon: Danie Binder, MD;  Location: [facility];  Service: Endoscopy;;  transverse x3; rectal and sigmoid polyps;  . torn right knee cartiledge  1970's      Family History  Problem Relation Age of Onset  . Cancer    . Arthritis    . Colon cancer Neg Hx    The patient  was quizzed about their family history and reported no history of bleeding problems or anesthesia problems in their family  Social History Social History  Substance Use Topics  . Smoking status: Current Every Day Smoker    Packs/day: 0.50    Years: 30.00    Types: Cigarettes  . Smokeless tobacco: Never Used     Comment: 1/2 pack a day  . Alcohol use 7.2 oz/week    12 Cans of beer per week    No Known Allergies  Current Outpatient Prescriptions  Medication Sig Dispense Refill  . aspirin 325 MG tablet Take 325 mg by mouth daily.     . Lycopene 10 MG CAPS Take 10 mg by mouth daily.    . Multiple Vitamins-Minerals (CENTRUM SILVER PO) Take 1 tablet by mouth daily.    Marland Kitchen oxyCODONE-acetaminophen (PERCOCET) 10-325 MG per tablet Take 1 tablet by mouth every 4 (four) hours as needed for pain.    . pravastatin (PRAVACHOL) 20 MG tablet Take 20 mg by mouth at bedtime.     . Saw Palmetto, Serenoa repens, (SAW PALMETTO PO) Take 1,800 mg by mouth daily.    . Tetrahydrozoline HCl (VISINE OP) Place 2 drops into both eyes daily as needed (dry eyes).     No current facility-administered medications for this visit.  Physical Exam BP 111/76   Pulse 88   Ht 6\' 3"  (1.905 m)   Wt 221 lb (100.2 kg)   BMI 27.62 kg/m  Physical Exam The patient is well developed well nourished and well groomed.  Orientation to person place and time is normal  Mood is pleasant.  Ambulatory status normal  Cervical spine exam is as follows stiffness loss of motion from prior fusion   Ortho Exam RIGHT  shoulder  Examination: Inspection reveals tenderness around the peri-acromial region. The patient has decreased range of motion 120 FLEXION 45 EXT ROT  and grade 5-/ 5 motor function of the rotator cuff. Stability in abduction external rotation is normal.   Neurovascular examination is intact and the lymph nodes in the axilla and supraclavicular regions are normal   The opposite shoulder LEFT exhibits  normal range of motion stability and strength neurovascular exam is intact, lymph nodes are negative and there is no swelling or tenderness   Data Reviewed IMAGING: AP/LAT RIGHT  Shoulder: I have read and interpret the x-ray as follows:   shoulder bone shows changes consistent with proximal migration of the humerus and greater tuberosity sclerosis which usually min chronic rotator cuff tear   Assessment    Right SHOULDER  ROTATOR CUFF SYNDROME CHRONIC ROTATOR CUFF TEAR  SHOULDER PAIN     Plan    NSAIDS-PREDNISONE 10 QD  Therapy AT HOME  INJECTION RIGHT SHOULDER  F/U 2 MONTHS    Procedure note the subacromial injection shoulder RIGHT  Verbal consent was obtained to inject the  RIGHT   Shoulder  Timeout was completed to confirm the injection site is a subacromial space of the  RIGHT  shoulder   Medication used Depo-Medrol 40 mg and lidocaine 1% 3 cc  Anesthesia was provided by ethyl chloride  The injection was performed in the RIGHT  posterior subacromial space. After pinning the skin with alcohol and anesthetized the skin with ethyl chloride the subacromial space was injected using a 20-gauge needle. There were no complications  Sterile dressing was applied.    Arther Abbott, MD 05/13/2016 4:42 PM

## 2016-07-08 ENCOUNTER — Encounter: Payer: Self-pay | Admitting: Orthopedic Surgery

## 2016-07-08 ENCOUNTER — Ambulatory Visit (INDEPENDENT_AMBULATORY_CARE_PROVIDER_SITE_OTHER): Payer: BLUE CROSS/BLUE SHIELD | Admitting: Orthopedic Surgery

## 2016-07-08 DIAGNOSIS — M75101 Unspecified rotator cuff tear or rupture of right shoulder, not specified as traumatic: Secondary | ICD-10-CM | POA: Diagnosis not present

## 2016-07-08 DIAGNOSIS — M25511 Pain in right shoulder: Secondary | ICD-10-CM | POA: Diagnosis not present

## 2016-07-08 NOTE — Progress Notes (Signed)
Patient ID: Mitchell Ross, male   DOB: 12/28/1955, 61 y.o.   MRN: [id number]  Chief Complaint  Patient presents with  . Follow-up    RIGHT SHOULDER PAIN    HPI HPI Mitchell Ross is a 61 y.o. male.  Shoulder Pain: 61 year old male with a history of a left rotator cuff repair which was treated with injection the patient was obscured to have the surgery has some mild pain there but presents now with recent onset of right shoulder pain weakness decreased range of motion a constant dull ache in the right upper arm for the last month or so   No treatment worsening   Review of Systems  Neurological: Negative for tingling and focal weakness.      There were no vitals taken for this visit. Gen. appearance is normal grooming and hygiene Orientation to person place and time normal Mood normal Gait is normal  No peripheral edema or swelling is noted in the Right or left arm Sensory exam shows normal sensation to palpation, pressure and soft touch Skin exam no lacerations ulcerations or erythema  Ortho Exam Both shoulders exhibit adequate rotator cuff strength and range of motion  A/P  Medical decision-making  Encounter Diagnoses  Name Primary?  . Pain in joint of right shoulder Yes  . Tear of right rotator cuff, unspecified tear extent     Patient is adequate strength in his rotator cuff his pain is improved. We recommended no surgery at this time  Arther Abbott, MD 07/08/2016 2:10 PM

## 2016-09-25 DIAGNOSIS — G894 Chronic pain syndrome: Secondary | ICD-10-CM | POA: Diagnosis not present

## 2016-09-25 DIAGNOSIS — E782 Mixed hyperlipidemia: Secondary | ICD-10-CM | POA: Diagnosis not present

## 2016-09-25 DIAGNOSIS — M1991 Primary osteoarthritis, unspecified site: Secondary | ICD-10-CM | POA: Diagnosis not present

## 2016-09-25 DIAGNOSIS — Z1389 Encounter for screening for other disorder: Secondary | ICD-10-CM | POA: Diagnosis not present

## 2016-09-25 DIAGNOSIS — I10 Essential (primary) hypertension: Secondary | ICD-10-CM | POA: Diagnosis not present

## 2016-09-25 DIAGNOSIS — Z6831 Body mass index (BMI) 31.0-31.9, adult: Secondary | ICD-10-CM | POA: Diagnosis not present

## 2016-11-14 DIAGNOSIS — Z1389 Encounter for screening for other disorder: Secondary | ICD-10-CM | POA: Diagnosis not present

## 2016-11-14 DIAGNOSIS — M0689 Other specified rheumatoid arthritis, multiple sites: Secondary | ICD-10-CM | POA: Diagnosis not present

## 2016-11-14 DIAGNOSIS — Z6832 Body mass index (BMI) 32.0-32.9, adult: Secondary | ICD-10-CM | POA: Diagnosis not present

## 2016-11-14 DIAGNOSIS — K648 Other hemorrhoids: Secondary | ICD-10-CM | POA: Diagnosis not present

## 2016-11-14 DIAGNOSIS — K625 Hemorrhage of anus and rectum: Secondary | ICD-10-CM | POA: Diagnosis not present

## 2016-11-14 DIAGNOSIS — K5909 Other constipation: Secondary | ICD-10-CM | POA: Diagnosis not present

## 2016-11-20 ENCOUNTER — Other Ambulatory Visit: Payer: Self-pay | Admitting: Podiatry

## 2016-11-20 ENCOUNTER — Encounter: Payer: Self-pay | Admitting: Podiatry

## 2016-11-20 ENCOUNTER — Ambulatory Visit (INDEPENDENT_AMBULATORY_CARE_PROVIDER_SITE_OTHER): Payer: BLUE CROSS/BLUE SHIELD

## 2016-11-20 ENCOUNTER — Ambulatory Visit (INDEPENDENT_AMBULATORY_CARE_PROVIDER_SITE_OTHER): Payer: BLUE CROSS/BLUE SHIELD | Admitting: Podiatry

## 2016-11-20 DIAGNOSIS — M779 Enthesopathy, unspecified: Secondary | ICD-10-CM | POA: Diagnosis not present

## 2016-11-20 DIAGNOSIS — M79672 Pain in left foot: Secondary | ICD-10-CM

## 2016-11-20 DIAGNOSIS — M79671 Pain in right foot: Secondary | ICD-10-CM

## 2016-11-20 DIAGNOSIS — M2040 Other hammer toe(s) (acquired), unspecified foot: Secondary | ICD-10-CM

## 2016-11-20 MED ORDER — TRIAMCINOLONE ACETONIDE 10 MG/ML IJ SUSP
10.0000 mg | Freq: Once | INTRAMUSCULAR | Status: AC
  Administered 2016-11-20: 10 mg

## 2016-11-24 ENCOUNTER — Telehealth: Payer: Self-pay | Admitting: Gastroenterology

## 2016-11-24 ENCOUNTER — Telehealth: Payer: Self-pay

## 2016-11-24 NOTE — Telephone Encounter (Signed)
I called Kennyth Lose back and told her that I don't see that he has signed for [place] to discuss his health issues with her. She said he is in bed now, just worked a 12 hr shift at [employer]. She will call him and have him call me to discuss his problems.

## 2016-11-24 NOTE — Telephone Encounter (Signed)
Pt's sister Kennyth Lose) is calling because she is worried about him having rectal bleeding. She said that this has been going on for 2 wks. He has seen his PCP last wks and they gave him some supp.only helped very little. The blood is not bright red and that is why they are worried. We have him an appointment on 01/06/17 but they feeling like that is to long to wait.  Please advise

## 2016-11-24 NOTE — Telephone Encounter (Signed)
  See previous note from this morning.

## 2016-11-24 NOTE — Telephone Encounter (Signed)
Patient's sister, Kennyth Lose, called to see about getting patient seen sooner. She can be reached at [phone] or [phone].

## 2016-11-25 NOTE — Telephone Encounter (Signed)
Tried to call with no answer  

## 2016-11-25 NOTE — Telephone Encounter (Signed)
Agree with need to discuss with patient and ov. Can we use an urgent spot?

## 2016-11-26 ENCOUNTER — Encounter: Payer: Self-pay | Admitting: Gastroenterology

## 2016-11-26 ENCOUNTER — Other Ambulatory Visit: Payer: Self-pay

## 2016-11-26 ENCOUNTER — Ambulatory Visit (INDEPENDENT_AMBULATORY_CARE_PROVIDER_SITE_OTHER): Payer: BLUE CROSS/BLUE SHIELD | Admitting: Gastroenterology

## 2016-11-26 DIAGNOSIS — K625 Hemorrhage of anus and rectum: Secondary | ICD-10-CM

## 2016-11-26 MED ORDER — LIDOCAINE-HYDROCORTISONE ACE 3-2.5 % RE KIT: PACK | RECTAL | 0 refills | Status: DC

## 2016-11-26 NOTE — Telephone Encounter (Signed)
LMOM to call back

## 2016-11-26 NOTE — Telephone Encounter (Signed)
Pt was seen in the office today.  

## 2016-11-26 NOTE — Patient Instructions (Addendum)
  USE HEMORRHOID CREAM 4 TIMES A DAY FOR 14 DAYS TO RELIEVE RECTAL PRESSURE/ITCHING/BLEEDING.   DRINK WATER TO KEEP YOUR URINE LIGHT YELLOW.  FOLLOW A HIGH FIBER DIET.   AVOID STRAINING AND CONSTIPATION. ADD MILK OF MAGNESIA PIILS OR LIQUID OR DULCOLAX 5 MG DAILY TO PREVENT CONSTIPATION.  COMPLETE FLEX SIG WITH HEMORRHOID BANDING ON SEP 27.   FOLLOW UP IN 4 MOS.   Hemorrhoids Hemorrhoids are dilated (enlarged) veins around the rectum. Sometimes clots will form in the veins. This makes them swollen and painful. These are called thrombosed hemorrhoids.  Causes of hemorrhoids include:  Constipation.   Straining to have a bowel movement.   HEAVY LIFTING  HOME CARE INSTRUCTIONS  Eat a well balanced diet and drink 6 to 8 glasses of water every day to avoid constipation. You may also use a bulk laxative.   Avoid straining to have bowel movements.   Keep anal area dry and clean.   Do not use a donut shaped pillow or sit on the toilet for long periods. This increases blood pooling and pain.   Move your bowels when your body has the urge; this will require less straining and will decrease pain and pressure.     HEMORRHOIDAL BANDING COMPLICATIONS:  COMMON: 1. MINOR PAIN  UNCOMMON: 1. ABSCESS 2. BAND FALLS OFF 3. PROLAPSE OF HEMORRHOIDS AND PAIN 4. ULCER BLEEDING  A. USUALLY SELF-LIMITED: MAY LAST 3-5 DAYS  B. MAY REQUIRE INTERVENTION: 1-2 WEEKS AFTER INTERACTIONS 5. NECROTIZING PELVIC SEPSIS  A. SYMPTOMS: FEVER, PAIN, DIFFICULTY URINATING

## 2016-11-26 NOTE — Progress Notes (Signed)
ON RECALL  °

## 2016-11-26 NOTE — Progress Notes (Signed)
CC'ED TO PCP 

## 2016-11-26 NOTE — Progress Notes (Signed)
Subjective:    Patient ID: Mitchell Ross, male   DOB: 61 y.o.   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 At this point I have recommended injection treatment and I went ahead and did sterile prep of the foot bilateral with anesthetized in the forefoot and then injected the second MPJ with a quarter cc deck quarter cc of Kenalog bilateral applied thick padding to take pressure off the joint surfaces. I then went ahead and scanned for a customized [employer] orthotics to reduce all forefoot pressure bilateral and work with the pedicle orthotist on this  X-rays indicate that there is moderate rigid contracture of the lesser digits bilateral

## 2016-11-26 NOTE — Progress Notes (Signed)
Subjective:    Patient ID: Mitchell Ross, male    DOB: Mar 19, 1955, 61 y.o.   MRN: [id number] [employer], MD  HPI STARTED 2-3 WEEKS AGO. BLEEDING IS LIGHT AND HEAVY. HAPPENS EVERY DAY. LIGHT: IN STOLL AND WHEN HE WIPES A BIT. HEAVY: DRIPS CONTINUOUS DRIP ON TOILET. NO ACCIDENTS IN UNDERWEAR. BRIGHT RED/BURGUNDY.  NO RECTAL PRESSURE, PAIN, ITCHING, BURNING, OR SOILING. BMs: REGULAR PRETTY MUCH. TAKES OPIOD WHEN HE WORKS AND GETS STOPPED UP FOR A DAY. MAY STRAIN TO PASS STOOL. MAY HAVE A CUTTING SENSATION WHEN STOOL PASSES. STOOLS CAN BE LARGE. MAY FEEL QUEAZY OR DULL ACHE(MIDDLE) WHEN HAVING BLEEDING THAT DOESN'T LAST THAT LONG. ASA DAILY. NO BC/GOODY POWDERS, IBUPROFEN/MOTRIN, OR NAPROXEN/ALEVE.   PT DENIES FEVER, CHILLS, HEMATEMESIS, vomiting, melena, diarrhea, CHEST PAIN, SHORTNESS OF BREATH, CHANGE IN BOWEL IN HABITS, abdominal pain, problems swallowing, OR heartburn or indigestion.  Past Medical History:  Diagnosis Date  . Arthritis   . Concussion with brief LOC 05/27/12  . Hepatitis C   . High cholesterol   . History of prostate biopsy    states it was negative  . Neck injury 05/27/12   Past Surgical History:  Procedure Laterality Date  . ANTERIOR CERVICAL DECOMP/DISCECTOMY FUSION N/A 01/20/2013   Procedure: ANTERIOR CERVICAL DECOMPRESSION/DISCECTOMY FUSION 1 LEVEL;  Surgeon: Erline Levine, MD;  Location: [facility];  Service: Neurosurgery;  Laterality: N/A;  C3-4 Anterior cervical decompression/diskectomy/fusion  . BIOPSY  03/03/2016     . COLONOSCOPY    . COLONOSCOPY N/A 03/03/2016     . FINGER SURGERY     tip of finger amputation  . POLYPECTOMY  03/03/2016     . torn right knee cartiledge  1970's   No Known Allergies  Current Outpatient Prescriptions  Medication Sig Dispense Refill  . amLODipine (NORVASC) 10 MG tablet Take 10 mg by mouth daily.    Marland Kitchen aspirin 325 MG tablet Take 325 mg by mouth daily.     . Lycopene 10 MG CAPS Take 10 mg by mouth daily.    .  CENTRUM SILVER PO Take 1 tablet by mouth daily.    .  (PERCOCET) 10-325 MG per tablet Take 1 tabletQ4H hours PRN PAIN    . pravastatin (PRAVACHOL) 20 MG tablet Take 20 mg by mouth at bedtime.     . SAW PALMETTO PO Take 1,800 mg by mouth daily.    . Tetrahydrozoline HCl (VISINE OP) Place 2 drops daily OU     Family History  Problem Relation Age of Onset  . Cancer Unknown   . Arthritis Unknown   . Colon cancer Neg Hx    Social History  Substance Use Topics  . Smoking status: Current Every Day Smoker    Packs/day: 0.50    Years: 30.00    Types: Cigarettes  . Smokeless tobacco: Never Used     Comment: 1/2 pack a day  . Alcohol use 7.2 oz/week    12 Cans of beer per week   Review of Systems PER HPI OTHERWISE ALL SYSTEMS ARE NEGATIVE.    Objective:   Physical Exam  Constitutional: He is oriented to person, place, and time. He appears well-developed and well-nourished. No distress.  HENT:  Head: Normocephalic and atraumatic.  Mouth/Throat: Oropharynx is clear and moist. No oropharyngeal exudate.  Eyes: Pupils are equal, round, and reactive to light. No scleral icterus.  Neck: Normal range of motion. Neck supple.  Cardiovascular: Normal rate, regular rhythm and normal heart sounds.  Pulmonary/Chest: Effort normal and breath sounds normal. No respiratory distress.  Abdominal: Soft. Bowel sounds are normal. He exhibits no distension. There is no tenderness.  Musculoskeletal: He exhibits no edema.  Lymphadenopathy:    He has no cervical adenopathy.  Neurological: He is alert and oriented to person, place, and time.  NO FOCAL DEFICITS  Psychiatric: He has a normal mood and affect.  Vitals reviewed.         Assessment & Plan:

## 2016-11-27 ENCOUNTER — Telehealth: Payer: Self-pay | Admitting: Orthotics

## 2016-11-27 NOTE — Telephone Encounter (Signed)
Left message to contact us.  Richy does not have scans to make F/O and Mr. Joiner needs to step in office for a second to be cast in foam.

## 2016-12-01 ENCOUNTER — Telehealth: Payer: Self-pay | Admitting: Podiatry

## 2016-12-01 NOTE — Telephone Encounter (Signed)
Mitchell Ross for pt to call to schedule an appt with Liliane Channel to be re scanned for the orthotics.

## 2016-12-04 ENCOUNTER — Ambulatory Visit: Payer: BLUE CROSS/BLUE SHIELD | Admitting: Orthotics

## 2016-12-04 DIAGNOSIS — M2040 Other hammer toe(s) (acquired), unspecified foot: Secondary | ICD-10-CM

## 2016-12-04 DIAGNOSIS — M779 Enthesopathy, unspecified: Secondary | ICD-10-CM

## 2016-12-04 NOTE — Progress Notes (Signed)
rescanned for F/O....Richy never got first ones.

## 2016-12-10 ENCOUNTER — Encounter (HOSPITAL_COMMUNITY): Payer: Self-pay | Admitting: *Deleted

## 2016-12-11 ENCOUNTER — Encounter (HOSPITAL_COMMUNITY)
Admission: RE | Disposition: A | Discharge status: Home / Self Care | Payer: Self-pay | Source: Ambulatory Visit | Attending: Gastroenterology

## 2016-12-11 ENCOUNTER — Ambulatory Visit (HOSPITAL_COMMUNITY)
Admission: RE | Admit: 2016-12-11 | Discharge: 2016-12-11 | Disposition: A | Discharge status: Home / Self Care | Payer: BLUE CROSS/BLUE SHIELD | Source: Ambulatory Visit | Attending: Gastroenterology | Admitting: Gastroenterology

## 2016-12-11 ENCOUNTER — Encounter (HOSPITAL_COMMUNITY): Payer: Self-pay

## 2016-12-11 DIAGNOSIS — Z79899 Other long term (current) drug therapy: Secondary | ICD-10-CM | POA: Insufficient documentation

## 2016-12-11 DIAGNOSIS — Z7982 Long term (current) use of aspirin: Secondary | ICD-10-CM | POA: Diagnosis not present

## 2016-12-11 DIAGNOSIS — F17210 Nicotine dependence, cigarettes, uncomplicated: Secondary | ICD-10-CM | POA: Diagnosis not present

## 2016-12-11 DIAGNOSIS — E7800 Pure hypercholesterolemia, unspecified: Secondary | ICD-10-CM | POA: Insufficient documentation

## 2016-12-11 DIAGNOSIS — K921 Melena: Secondary | ICD-10-CM | POA: Diagnosis not present

## 2016-12-11 DIAGNOSIS — B1920 Unspecified viral hepatitis C without hepatic coma: Secondary | ICD-10-CM | POA: Insufficient documentation

## 2016-12-11 DIAGNOSIS — Z981 Arthrodesis status: Secondary | ICD-10-CM | POA: Insufficient documentation

## 2016-12-11 DIAGNOSIS — K648 Other hemorrhoids: Secondary | ICD-10-CM | POA: Insufficient documentation

## 2016-12-11 DIAGNOSIS — M1990 Unspecified osteoarthritis, unspecified site: Secondary | ICD-10-CM | POA: Diagnosis not present

## 2016-12-11 DIAGNOSIS — K5730 Diverticulosis of large intestine without perforation or abscess without bleeding: Secondary | ICD-10-CM | POA: Insufficient documentation

## 2016-12-11 DIAGNOSIS — K625 Hemorrhage of anus and rectum: Secondary | ICD-10-CM

## 2016-12-11 DIAGNOSIS — K644 Residual hemorrhoidal skin tags: Secondary | ICD-10-CM | POA: Insufficient documentation

## 2016-12-11 HISTORY — PX: HEMORRHOID BANDING: SHX5850

## 2016-12-11 HISTORY — PX: FLEXIBLE SIGMOIDOSCOPY: SHX5431

## 2016-12-11 SURGERY — SIGMOIDOSCOPY, FLEXIBLE
Anesthesia: Moderate Sedation

## 2016-12-11 MED ORDER — SODIUM CHLORIDE 0.9 % IV SOLN
INTRAVENOUS | Status: DC
Start: 2016-12-11 — End: 2016-12-11
  Administered 2016-12-11: 07:00:00 via INTRAVENOUS

## 2016-12-11 MED ORDER — MEPERIDINE HCL 100 MG/ML IJ SOLN
INTRAMUSCULAR | Status: AC
  Filled 2016-12-11: qty 2

## 2016-12-11 MED ORDER — MEPERIDINE HCL 100 MG/ML IJ SOLN
INTRAMUSCULAR | Status: DC | PRN
  Administered 2016-12-11 (×2): 25 mg via INTRAVENOUS

## 2016-12-11 MED ORDER — MIDAZOLAM HCL 5 MG/5ML IJ SOLN
INTRAMUSCULAR | Status: AC
  Filled 2016-12-11: qty 10

## 2016-12-11 MED ORDER — MIDAZOLAM HCL 5 MG/5ML IJ SOLN
INTRAMUSCULAR | Status: DC | PRN
  Administered 2016-12-11 (×2): 2 mg via INTRAVENOUS

## 2016-12-11 NOTE — Discharge Instructions (Signed)
HEMORRHOIDAL BANDING DISCHARGE INSTRUCTIONS  I PUT BANDS IN 3 AREAS ABOVE YOUR HEMORRHOIDS. CALL [phone] FOR FOR RECTAL BLEEDING, FEVER, PAIN, OR DIFFICULTY URINATING. YOU MAY SEE BLEEDING FOR 3 DAYS TO 2 WEEKS.    HOME CARE INSTRUCTIONS  FOLLOWING YOUR PROCEDURE IT IS COMMON TO HAVE MINOR RECTAL DISCOMFORT OR PAIN. 1.  YOU may use ibuprofen 200 MG over-the-counter 1 OR 2 every 6 hours as needed for mild rectal pain OR TYLENOL.   Take the ibuprofen with food and milk.  2   Use sitz bath 3 times a day  OR ANA-MANTLE AS NEEDED FOR RECTAL PAIN.  3. Avoid straining to have bowel movements. 4. Keep anal area dry and clean.  5. Do not use a donut shaped pillow or sit on the toilet for long periods. This increases blood pooling and pain.  6. Move your bowels when your body has the urge; this will require less straining and will decrease pain and pressure.  7. MAY INCREASE Colace 100 mg twice daily FOR 7 DAYS to soften the stool. HOLD FOR DIARRHEA. 8. CONTINUE MOM IF NEEDED TO AVOID CONSTIPATION      SIGMOIDOSCOPY Care After Read the instructions outlined below and refer to this sheet in the next week. These discharge instructions provide you with general information on caring for yourself after you leave the hospital. While your treatment has been planned according to the most current medical practices available, unavoidable complications occasionally occur. If you have any problems or questions after discharge, call DR. Kristol Almanzar, [phone].  ACTIVITY  You may resume your regular activity, but move at a slower pace for the next 24 hours.   Take frequent rest periods for the next 24 hours.   Walking will help get rid of the air and reduce the bloated feeling in your belly (abdomen).   No driving for 24 hours (because of the medicine (anesthesia) used during the test).   You may shower.   Do not sign any important legal documents or operate any machinery for 24 hours (because of the  anesthesia used during the test).    NUTRITION  Drink plenty of fluids.   You may resume your normal diet as instructed by your doctor.   Begin with a light meal and progress to your normal diet. Heavy or fried foods are harder to digest and may make you feel sick to your stomach (nauseated).   Avoid alcoholic beverages for 24 hours or as instructed.    MEDICATIONS  You may resume your normal medications.   WHAT YOU CAN EXPECT TODAY  Some feelings of bloating in the abdomen.   Passage of more gas than usual.   Spotting of blood in your stool or on the toilet paper  .  IF YOU HAD POLYPS REMOVED DURING THE COLONOSCOPY:  Eat a soft diet IF YOU HAVE NAUSEA, BLOATING, ABDOMINAL PAIN, OR VOMITING.    FINDING OUT THE RESULTS OF YOUR TEST Not all test results are available during your visit. DR. Oneida Alar WILL CALL YOU WITHIN 7 DAYS OF YOUR PROCEDUE WITH YOUR RESULTS. Do not assume everything is normal if you have not heard from DR. Casimiro Lienhard IN ONE WEEK, CALL HER OFFICE AT [phone].  SEEK IMMEDIATE MEDICAL ATTENTION AND CALL THE OFFICE: [phone] IF:  You have more than a spotting of blood in your stool.   Your belly is swollen (abdominal distention).   You are nauseated or vomiting.   You have a temperature over 101F.   You have  abdominal pain or discomfort that is severe or gets worse throughout the day.    HEMORRHOIDAL BANDING COMPLICATIONS:  COMMON: 1. MINOR PAIN  UNCOMMON: 1. ABSCESS 2. BAND FALLS OFF 3. PROLAPSE OF HEMORRHOIDS AND PAIN 4. ULCER BLEEDING  A. USUALLY SELF-LIMITED: MAY LAST 3-5 DAYS  B. MAY REQUIRE INTERVENTION: 1-2 WEEKS AFTER INTERACTIONS 5. NECROTIZING PELVIC SEPSIS  A. SYMPTOMS: FEVER, PAIN, DIFFICULTY URINATING

## 2016-12-11 NOTE — Interval H&P Note (Signed)
History and Physical Interval Note:  12/11/2016 7:30 AM  Mitchell Ross  has presented today for surgery, with the diagnosis of RECTAL BLEEDING  The various methods of treatment have been discussed with the patient and family. After consideration of risks, benefits and other options for treatment, the patient has consented to  Procedure(s) with comments: FLEXIBLE SIGMOIDOSCOPY (N/A) - [place] (N/A) as a surgical intervention .  The patient's history has been reviewed, patient examined, no change in status, stable for surgery.  I have reviewed the patient's chart and labs.  Questions were answered to the patient's satisfaction.     [employer]

## 2016-12-11 NOTE — H&P (View-Only) (Signed)
Subjective:    Patient ID: Mitchell Ross, male    DOB: [birth year], 61 y.o.   MRN: [id number] [employer], MD  HPI STARTED 2-3 WEEKS AGO. BLEEDING IS LIGHT AND HEAVY. HAPPENS EVERY DAY. LIGHT: IN STOLL AND WHEN HE WIPES A BIT. HEAVY: DRIPS CONTINUOUS DRIP ON TOILET. NO ACCIDENTS IN UNDERWEAR. BRIGHT RED/BURGUNDY.  NO RECTAL PRESSURE, PAIN, ITCHING, BURNING, OR SOILING. BMs: REGULAR PRETTY MUCH. TAKES OPIOD WHEN HE WORKS AND GETS STOPPED UP FOR A DAY. MAY STRAIN TO PASS STOOL. MAY HAVE A CUTTING SENSATION WHEN STOOL PASSES. STOOLS CAN BE LARGE. MAY FEEL QUEAZY OR DULL ACHE(MIDDLE) WHEN HAVING BLEEDING THAT DOESN'T LAST THAT LONG. ASA DAILY. NO BC/GOODY POWDERS, IBUPROFEN/MOTRIN, OR NAPROXEN/ALEVE.   PT DENIES FEVER, CHILLS, HEMATEMESIS, vomiting, melena, diarrhea, CHEST PAIN, SHORTNESS OF BREATH, CHANGE IN BOWEL IN HABITS, abdominal pain, problems swallowing, OR heartburn or indigestion.  Past Medical History:  Diagnosis Date  . Arthritis   . Concussion with brief LOC 05/27/12  . Hepatitis C   . High cholesterol   . History of prostate biopsy    states it was negative  . Neck injury 05/27/12   Past Surgical History:  Procedure Laterality Date  . ANTERIOR CERVICAL DECOMP/DISCECTOMY FUSION N/A 01/20/2013   Procedure: ANTERIOR CERVICAL DECOMPRESSION/DISCECTOMY FUSION 1 LEVEL;  Surgeon: Erline Levine, MD;  Location: [facility];  Service: Neurosurgery;  Laterality: N/A;  C3-4 Anterior cervical decompression/diskectomy/fusion  . BIOPSY  03/03/2016     . COLONOSCOPY    . COLONOSCOPY N/A 03/03/2016     . FINGER SURGERY     tip of finger amputation  . POLYPECTOMY  03/03/2016     . torn right knee cartiledge  1970's   No Known Allergies  Current Outpatient Prescriptions  Medication Sig Dispense Refill  . amLODipine (NORVASC) 10 MG tablet Take 10 mg by mouth daily.    Marland Kitchen aspirin 325 MG tablet Take 325 mg by mouth daily.     . Lycopene 10 MG CAPS Take 10 mg by mouth daily.    .  CENTRUM SILVER PO Take 1 tablet by mouth daily.    .  (PERCOCET) 10-325 MG per tablet Take 1 tabletQ4H hours PRN PAIN    . pravastatin (PRAVACHOL) 20 MG tablet Take 20 mg by mouth at bedtime.     . SAW PALMETTO PO Take 1,800 mg by mouth daily.    . Tetrahydrozoline HCl (VISINE OP) Place 2 drops daily OU     Family History  Problem Relation Age of Onset  . Cancer Unknown   . Arthritis Unknown   . Colon cancer Neg Hx    Social History  Substance Use Topics  . Smoking status: Current Every Day Smoker    Packs/day: 0.50    Years: 30.00    Types: Cigarettes  . Smokeless tobacco: Never Used     Comment: 1/2 pack a day  . Alcohol use 7.2 oz/week    12 Cans of beer per week   Review of Systems PER HPI OTHERWISE ALL SYSTEMS ARE NEGATIVE.    Objective:   Physical Exam  Constitutional: He is oriented to person, place, and time. He appears well-developed and well-nourished. No distress.  HENT:  Head: Normocephalic and atraumatic.  Mouth/Throat: Oropharynx is clear and moist. No oropharyngeal exudate.  Eyes: Pupils are equal, round, and reactive to light. No scleral icterus.  Neck: Normal range of motion. Neck supple.  Cardiovascular: Normal rate, regular rhythm and normal heart sounds.  Pulmonary/Chest: Effort normal and breath sounds normal. No respiratory distress.  Abdominal: Soft. Bowel sounds are normal. He exhibits no distension. There is no tenderness.  Musculoskeletal: He exhibits no edema.  Lymphadenopathy:    He has no cervical adenopathy.  Neurological: He is alert and oriented to person, place, and time.  NO FOCAL DEFICITS  Psychiatric: He has a normal mood and affect.  Vitals reviewed.         Assessment & Plan:

## 2016-12-11 NOTE — Op Note (Signed)
[facility] Patient Name: Mitchell Ross Procedure Date: 12/11/2016 7:04 AM MRN: [id number] Date of Birth: 10-28-1955 Attending MD: Barney Drain MD, MD CSN: [id number] Age: 61 Admit Type: Outpatient Procedure:                Flexible Sigmoidoscopy with hemorrhoid banding X3 Indications:              Hematochezia on ASA Providers:                Barney Drain MD, MD, Lurline Del, RN, Aram Candela Referring MD:             [employer], MD Medicines:                Meperidine 50 mg IV, Midazolam 4 mg IV Complications:            No immediate complications. Estimated Blood Loss:     Estimated blood loss was minimal. Procedure:                Pre-Anesthesia Assessment:                           - Prior to the procedure, a History and Physical                            was performed, and patient medications and                            allergies were reviewed. The patient's tolerance of                            previous anesthesia was also reviewed. The risks                            and benefits of the procedure and the sedation                            options and risks were discussed with the patient.                            All questions were answered, and informed consent                            was obtained. Prior Anticoagulants: The patient has                            taken aspirin, last dose was 1 day prior to                            procedure. ASA Grade Assessment: II - A patient                            with mild systemic disease. After reviewing the                            risks and benefits, the patient was deemed in  satisfactory condition to undergo the procedure.                            After obtaining informed consent, the scope was                            passed under direct vision. The FG-1829H ([id number])                            scope was introduced through the anus and advanced                            to  the the descending colon. The flexible                            sigmoidoscopy was accomplished without difficulty.                            The patient tolerated the procedure well. The                            quality of the bowel preparation was good. Scope In: 7:43:32 AM Scope Out: 7:53:24 AM Total Procedure Duration: 0 hours 9 minutes 52 seconds  Findings:      The perianal and digital rectal examinations were normal.      Non-bleeding internal hemorrhoids were found during retroflexion. The       hemorrhoids were moderate. Three bands were successfully placed. A slow       ooze remained at the end of the procedure. Estimated blood loss was       minimal.      External hemorrhoids were found during retroflexion. The hemorrhoids       were small.      Multiple small and large-mouthed diverticula were found in the sigmoid       colon and descending colon. Impression:               - Non-bleeding internal hemorrhoids. Banded.                           - External hemorrhoids.                           - Diverticulosis in the sigmoid colon and in the                            descending colon. Moderate Sedation:      Moderate (conscious) sedation was administered by the endoscopy nurse       and supervised by the endoscopist. The following parameters were       monitored: oxygen saturation, heart rate, blood pressure, and response       to care. Total physician intraservice time was 19 minutes. Recommendation:           - High fiber diet.                           - Continue present medications.                           -  Return to my office in 4 months.                           - Patient has a contact number available for                            emergencies. The signs and symptoms of potential                            delayed complications were discussed with the                            patient. Return to normal activities tomorrow.                            Written  discharge instructions were provided to the                            patient. Procedure Code(s):        --- Professional ---                           [phone], Sigmoidoscopy, flexible; with band                            ligation(s) (eg, hemorrhoids)                           99152, Moderate sedation services provided by the                            same physician or other qualified health care                            professional performing the diagnostic or                            therapeutic service that the sedation supports,                            requiring the presence of an independent trained                            observer to assist in the monitoring of the                            patient's level of consciousness and physiological                            status; initial 15 minutes of intraservice time,                            patient age 69 years or older Diagnosis Code(s):        --- Professional ---  K64.4, Residual hemorrhoidal skin tags                           K64.8, Other hemorrhoids                           K92.1, Melena (includes Hematochezia)                           K57.30, Diverticulosis of large intestine without                            perforation or abscess without bleeding CPT copyright 2016 American Medical Association. All rights reserved. The codes documented in this report are preliminary and upon coder review may  be revised to meet current compliance requirements. Barney Drain, MD Barney Drain MD, MD 12/11/2016 8:02:05 AM This report has been signed electronically. Number of Addenda: 0

## 2016-12-15 ENCOUNTER — Encounter (HOSPITAL_COMMUNITY): Payer: Self-pay | Admitting: Gastroenterology

## 2016-12-17 ENCOUNTER — Ambulatory Visit (INDEPENDENT_AMBULATORY_CARE_PROVIDER_SITE_OTHER): Payer: Self-pay | Admitting: Orthotics

## 2016-12-17 DIAGNOSIS — M79671 Pain in right foot: Secondary | ICD-10-CM

## 2016-12-17 DIAGNOSIS — M79672 Pain in left foot: Secondary | ICD-10-CM

## 2016-12-17 DIAGNOSIS — M2040 Other hammer toe(s) (acquired), unspecified foot: Secondary | ICD-10-CM

## 2016-12-17 NOTE — Progress Notes (Signed)
Patient came in today to pick up custom made foot orthotics.  The goals were accomplished and the patient reported no dissatisfaction with said orthotics.  Patient was advised of breakin period and how to report any issues. 

## 2017-01-06 ENCOUNTER — Ambulatory Visit: Payer: Self-pay | Admitting: Nurse Practitioner

## 2017-02-19 ENCOUNTER — Encounter: Payer: Self-pay | Admitting: Gastroenterology

## 2017-02-25 DIAGNOSIS — R972 Elevated prostate specific antigen [PSA]: Secondary | ICD-10-CM | POA: Diagnosis not present

## 2017-03-04 DIAGNOSIS — N5201 Erectile dysfunction due to arterial insufficiency: Secondary | ICD-10-CM | POA: Diagnosis not present

## 2017-03-04 DIAGNOSIS — R972 Elevated prostate specific antigen [PSA]: Secondary | ICD-10-CM | POA: Diagnosis not present

## 2017-06-29 DIAGNOSIS — M7582 Other shoulder lesions, left shoulder: Secondary | ICD-10-CM | POA: Diagnosis not present

## 2017-06-29 DIAGNOSIS — Z6832 Body mass index (BMI) 32.0-32.9, adult: Secondary | ICD-10-CM | POA: Diagnosis not present

## 2017-06-29 DIAGNOSIS — M159 Polyosteoarthritis, unspecified: Secondary | ICD-10-CM | POA: Diagnosis not present

## 2017-06-29 DIAGNOSIS — R3129 Other microscopic hematuria: Secondary | ICD-10-CM | POA: Diagnosis not present

## 2017-06-29 DIAGNOSIS — G894 Chronic pain syndrome: Secondary | ICD-10-CM | POA: Diagnosis not present

## 2017-06-29 DIAGNOSIS — I10 Essential (primary) hypertension: Secondary | ICD-10-CM | POA: Diagnosis not present

## 2017-06-29 DIAGNOSIS — Z0001 Encounter for general adult medical examination with abnormal findings: Secondary | ICD-10-CM | POA: Diagnosis not present

## 2017-06-29 DIAGNOSIS — E6609 Other obesity due to excess calories: Secondary | ICD-10-CM | POA: Diagnosis not present

## 2017-06-29 DIAGNOSIS — Z1389 Encounter for screening for other disorder: Secondary | ICD-10-CM | POA: Diagnosis not present

## 2017-06-29 DIAGNOSIS — B182 Chronic viral hepatitis C: Secondary | ICD-10-CM | POA: Diagnosis not present

## 2017-08-12 ENCOUNTER — Ambulatory Visit (INDEPENDENT_AMBULATORY_CARE_PROVIDER_SITE_OTHER): Payer: BLUE CROSS/BLUE SHIELD

## 2017-08-12 ENCOUNTER — Encounter: Payer: Self-pay | Admitting: Podiatry

## 2017-08-12 ENCOUNTER — Ambulatory Visit: Payer: BLUE CROSS/BLUE SHIELD | Admitting: Podiatry

## 2017-08-12 DIAGNOSIS — M2040 Other hammer toe(s) (acquired), unspecified foot: Secondary | ICD-10-CM

## 2017-08-12 DIAGNOSIS — M779 Enthesopathy, unspecified: Secondary | ICD-10-CM

## 2017-08-12 DIAGNOSIS — M722 Plantar fascial fibromatosis: Secondary | ICD-10-CM

## 2017-08-12 MED ORDER — TRIAMCINOLONE ACETONIDE 10 MG/ML IJ SUSP: 10.0000 mg | Freq: Once | INTRAMUSCULAR | Status: DC

## 2017-08-12 MED ORDER — METHYLPREDNISOLONE 4 MG PO TBPK: ORAL_TABLET | ORAL | 0 refills | Status: DC

## 2017-08-12 NOTE — Progress Notes (Signed)
Subjective:   Patient ID: Mitchell Ross, male   DOB: 62 y.o.   MRN: [id number]   HPI Patient presents stating he is developed acute discomfort in the bottom of his left heel and is making it hard to walk and he also has lesions on both feet with digital deformities bilateral   ROS      Objective:  Physical Exam  Neurovascular status found to be intact muscle strength is adequate range of motion within normal limits with exquisite discomfort plantar aspect left heel and continued digital deformities with distal keratotic lesions digit 3 bilateral that are painful when palpated.  Patient has good digital perfusion     Assessment:  Acute plantar fasciitis of the left heel with hammertoe deformity digit 3 bilateral     Plan:  H&P x-ray left reviewed both conditions discussed and at this point continue with conservative care for the hammertoes with consideration for digital fusion at one point.  I injected the plantar fascial left 3 mg Kenalog 5 mg Xylocaine applied fascial brace and he will bring in his old orthotics at next visit we will see what else we can do to help him  X-ray indicates plantar spur with no indications of stress fracture arthritis

## 2017-08-12 NOTE — Patient Instructions (Addendum)

## 2017-08-21 ENCOUNTER — Ambulatory Visit: Payer: BLUE CROSS/BLUE SHIELD | Admitting: Podiatry

## 2017-08-21 ENCOUNTER — Encounter: Payer: Self-pay | Admitting: Podiatry

## 2017-08-21 DIAGNOSIS — M722 Plantar fascial fibromatosis: Secondary | ICD-10-CM

## 2017-08-21 MED ORDER — TRIAMCINOLONE ACETONIDE 10 MG/ML IJ SUSP
10.0000 mg | Freq: Once | INTRAMUSCULAR | Status: AC
  Administered 2017-08-21: 10 mg

## 2017-08-23 NOTE — Progress Notes (Signed)
Subjective:   Patient ID: Mitchell Ross, male   DOB: 62 y.o.   MRN: [id number]   HPI Patient states that the heel is better than it was but it still painful and orthotics are brought in that have lost the ability to hold his arch properly   ROS      Objective:  Physical Exam  Neurovascular status found to be intact muscle strength was adequate range of motion within normal limits with patient found to have exquisite discomfort plantar aspect left heel at the insertional point of the tendon into the calcaneus with fluid buildup noted.  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  I then went ahead and casted him for a [place] type orthotic with deep heel seat and good support of the longitudinal arch and we will have him seen by ped orthotist for dispensing.  We will see me as needed

## 2017-09-15 ENCOUNTER — Ambulatory Visit: Payer: BLUE CROSS/BLUE SHIELD | Admitting: Orthotics

## 2017-09-15 DIAGNOSIS — M779 Enthesopathy, unspecified: Secondary | ICD-10-CM

## 2017-09-15 NOTE — Progress Notes (Signed)
Patient came in today to pick up custom made foot orthotics.  The goals were accomplished and the patient reported no dissatisfaction with said orthotics.  Patient was advised of breakin period and how to report any issues.Patient came in today to pick up custom made foot orthotics.  The goals were accomplished and the patient reported no dissatisfaction with said orthotics.  Patient was advised of breakin period and how to report any issues. 

## 2018-01-28 DIAGNOSIS — M0689 Other specified rheumatoid arthritis, multiple sites: Secondary | ICD-10-CM | POA: Diagnosis not present

## 2018-01-28 DIAGNOSIS — Z6833 Body mass index (BMI) 33.0-33.9, adult: Secondary | ICD-10-CM | POA: Diagnosis not present

## 2018-01-28 DIAGNOSIS — Z1389 Encounter for screening for other disorder: Secondary | ICD-10-CM | POA: Diagnosis not present

## 2018-01-28 DIAGNOSIS — F1120 Opioid dependence, uncomplicated: Secondary | ICD-10-CM | POA: Diagnosis not present

## 2018-01-28 DIAGNOSIS — I10 Essential (primary) hypertension: Secondary | ICD-10-CM | POA: Diagnosis not present

## 2018-01-28 DIAGNOSIS — G894 Chronic pain syndrome: Secondary | ICD-10-CM | POA: Diagnosis not present

## 2018-03-09 DIAGNOSIS — R972 Elevated prostate specific antigen [PSA]: Secondary | ICD-10-CM | POA: Diagnosis not present

## 2018-03-16 DIAGNOSIS — N401 Enlarged prostate with lower urinary tract symptoms: Secondary | ICD-10-CM | POA: Diagnosis not present

## 2018-03-16 DIAGNOSIS — R351 Nocturia: Secondary | ICD-10-CM | POA: Diagnosis not present

## 2018-03-16 DIAGNOSIS — N5201 Erectile dysfunction due to arterial insufficiency: Secondary | ICD-10-CM | POA: Diagnosis not present

## 2018-03-16 DIAGNOSIS — R972 Elevated prostate specific antigen [PSA]: Secondary | ICD-10-CM | POA: Diagnosis not present

## 2018-10-01 DIAGNOSIS — Z6834 Body mass index (BMI) 34.0-34.9, adult: Secondary | ICD-10-CM | POA: Diagnosis not present

## 2018-10-01 DIAGNOSIS — M0689 Other specified rheumatoid arthritis, multiple sites: Secondary | ICD-10-CM | POA: Diagnosis not present

## 2018-10-01 DIAGNOSIS — M1991 Primary osteoarthritis, unspecified site: Secondary | ICD-10-CM | POA: Diagnosis not present

## 2018-10-01 DIAGNOSIS — E669 Obesity, unspecified: Secondary | ICD-10-CM | POA: Diagnosis not present

## 2018-10-01 DIAGNOSIS — B182 Chronic viral hepatitis C: Secondary | ICD-10-CM | POA: Diagnosis not present

## 2018-10-01 DIAGNOSIS — M2550 Pain in unspecified joint: Secondary | ICD-10-CM | POA: Diagnosis not present

## 2018-10-01 DIAGNOSIS — Z1389 Encounter for screening for other disorder: Secondary | ICD-10-CM | POA: Diagnosis not present

## 2018-10-01 DIAGNOSIS — N400 Enlarged prostate without lower urinary tract symptoms: Secondary | ICD-10-CM | POA: Diagnosis not present

## 2018-10-01 DIAGNOSIS — G894 Chronic pain syndrome: Secondary | ICD-10-CM | POA: Diagnosis not present

## 2018-12-21 DIAGNOSIS — E7489 Other specified disorders of carbohydrate metabolism: Secondary | ICD-10-CM | POA: Diagnosis not present

## 2018-12-21 DIAGNOSIS — Z6832 Body mass index (BMI) 32.0-32.9, adult: Secondary | ICD-10-CM | POA: Diagnosis not present

## 2018-12-21 DIAGNOSIS — N400 Enlarged prostate without lower urinary tract symptoms: Secondary | ICD-10-CM | POA: Diagnosis not present

## 2018-12-21 DIAGNOSIS — M1991 Primary osteoarthritis, unspecified site: Secondary | ICD-10-CM | POA: Diagnosis not present

## 2018-12-21 DIAGNOSIS — E6609 Other obesity due to excess calories: Secondary | ICD-10-CM | POA: Diagnosis not present

## 2018-12-21 DIAGNOSIS — R7309 Other abnormal glucose: Secondary | ICD-10-CM | POA: Diagnosis not present

## 2018-12-21 DIAGNOSIS — G894 Chronic pain syndrome: Secondary | ICD-10-CM | POA: Diagnosis not present

## 2018-12-21 DIAGNOSIS — I10 Essential (primary) hypertension: Secondary | ICD-10-CM | POA: Diagnosis not present

## 2018-12-21 DIAGNOSIS — Z0001 Encounter for general adult medical examination with abnormal findings: Secondary | ICD-10-CM | POA: Diagnosis not present

## 2019-01-24 ENCOUNTER — Encounter: Payer: Self-pay | Admitting: Gastroenterology

## 2019-02-16 ENCOUNTER — Other Ambulatory Visit: Payer: Self-pay

## 2019-02-16 ENCOUNTER — Ambulatory Visit (INDEPENDENT_AMBULATORY_CARE_PROVIDER_SITE_OTHER): Payer: Self-pay | Admitting: *Deleted

## 2019-02-16 DIAGNOSIS — Z86010 Personal history of colonic polyps: Secondary | ICD-10-CM

## 2019-02-16 NOTE — Progress Notes (Signed)
Gastroenterology Pre-Procedure Review  Request Date: 02/16/2019 Requesting Physician: 3 year recall, Last TCS 03/03/2016, tubular adenoma, benign lymphoid polyp  PATIENT REVIEW QUESTIONS: The patient responded to the following health history questions as indicated:    1. Diabetes Melitis: no 2. Joint replacements in the past 12 months: no 3. Major health problems in the past 3 months: no 4. Has an artificial valve or MVP: no 5. Has a defibrillator: no 6. Has been advised in past to take antibiotics in advance of a procedure like teeth cleaning: no 7. Family history of colon cancer: no  8. Alcohol Use: yes, 2 or 3 beers a week 9. Illicit drug Use: no 10. History of sleep apnea: no  11. History of coronary artery or other vascular stents placed within the last 12 months: no 12. History of any prior anesthesia complications: no 13. There is no height or weight on file to calculate BMI. ht: 6'4 wt: 230 lbs    MEDICATIONS & ALLERGIES:    Patient reports the following regarding taking any blood thinners:   Plavix? no Aspirin? yes Coumadin? no Brilinta? no Xarelto? no Eliquis? no Pradaxa? no Savaysa? no Effient? no  Patient confirms/reports the following medications:  Current Outpatient Medications  Medication Sig Dispense Refill  . amLODipine (NORVASC) 10 MG tablet Take 10 mg by mouth daily.    Marland Kitchen aspirin 325 MG tablet Take 325 mg by mouth daily.     . COD LIVER OIL PO Take by mouth daily.    . Lycopene 10 MG CAPS Take 10 mg by mouth daily.    . Multiple Vitamins-Minerals (CENTRUM SILVER PO) Take 1 tablet by mouth daily.    . Multiple Vitamins-Minerals (ZINC PO) Take by mouth daily.    . Omega-3 Fatty Acids (FISH OIL PO) Take 1 tablet by mouth daily.    Marland Kitchen oxyCODONE-acetaminophen (PERCOCET) 10-325 MG per tablet Take 1 tablet by mouth as needed for pain.     . pravastatin (PRAVACHOL) 20 MG tablet Take 20 mg by mouth daily.     . Saw Palmetto, Serenoa repens, (SAW PALMETTO PO) Take  1,800 mg by mouth daily.    . SELENIUM PO Take by mouth daily.    . Tetrahydrozoline HCl (VISINE OP) Place 2 drops into both eyes daily as needed (dry eyes).    . TURMERIC PO Take 1 tablet by mouth daily.     Current Facility-Administered Medications  Medication Dose Route Frequency Provider Last Rate Last Dose  . triamcinolone acetonide (KENALOG) 10 MG/ML injection 10 mg  10 mg Other Once Wallene Huh, DPM        Patient confirms/reports the following allergies:  No Known Allergies  No orders of the defined types were placed in this encounter.   AUTHORIZATION INFORMATION Primary Insurance: [place],  ID [id number],  Group [id number] Pre-Cert / Auth required: No, file to local BC[employer]  SCHEDULE INFORMATION: Procedure has been scheduled as follows:  Date: , Time:  Location: [facility] with Dr. Oneida Alar  This Gastroenterology Pre-Precedure Review Form is being routed to the following provider(s): Neil Crouch, PA

## 2019-02-17 NOTE — Progress Notes (Signed)
Recommend ov due to need for MAC. Received 150 Percocet every 2-3 months per controlled substance database.

## 2019-02-21 ENCOUNTER — Telehealth: Payer: Self-pay | Admitting: *Deleted

## 2019-02-21 NOTE — Telephone Encounter (Signed)
Lmom for pt to call us back.  Pt needs to schedule ov before having his procedure.

## 2019-02-21 NOTE — Progress Notes (Signed)
Called pt and he requested to be scheduled to 03/15/2019 at 3:30.

## 2019-03-01 DIAGNOSIS — R351 Nocturia: Secondary | ICD-10-CM | POA: Diagnosis not present

## 2019-03-01 DIAGNOSIS — N401 Enlarged prostate with lower urinary tract symptoms: Secondary | ICD-10-CM | POA: Diagnosis not present

## 2019-03-09 DIAGNOSIS — R972 Elevated prostate specific antigen [PSA]: Secondary | ICD-10-CM | POA: Diagnosis not present

## 2019-03-09 DIAGNOSIS — R351 Nocturia: Secondary | ICD-10-CM | POA: Diagnosis not present

## 2019-03-09 DIAGNOSIS — N5201 Erectile dysfunction due to arterial insufficiency: Secondary | ICD-10-CM | POA: Diagnosis not present

## 2019-03-09 DIAGNOSIS — N401 Enlarged prostate with lower urinary tract symptoms: Secondary | ICD-10-CM | POA: Diagnosis not present

## 2019-03-15 ENCOUNTER — Ambulatory Visit: Payer: BC Managed Care – PPO | Admitting: Nurse Practitioner

## 2019-03-15 ENCOUNTER — Telehealth: Payer: Self-pay | Admitting: *Deleted

## 2019-03-15 ENCOUNTER — Ambulatory Visit (INDEPENDENT_AMBULATORY_CARE_PROVIDER_SITE_OTHER): Payer: BC Managed Care – PPO | Admitting: Nurse Practitioner

## 2019-03-15 ENCOUNTER — Encounter: Payer: Self-pay | Admitting: Nurse Practitioner

## 2019-03-15 ENCOUNTER — Other Ambulatory Visit: Payer: Self-pay

## 2019-03-15 ENCOUNTER — Other Ambulatory Visit: Payer: Self-pay | Admitting: *Deleted

## 2019-03-15 DIAGNOSIS — Z0000 Encounter for general adult medical examination without abnormal findings: Secondary | ICD-10-CM | POA: Insufficient documentation

## 2019-03-15 DIAGNOSIS — Z1211 Encounter for screening for malignant neoplasm of colon: Secondary | ICD-10-CM

## 2019-03-15 DIAGNOSIS — Z8719 Personal history of other diseases of the digestive system: Secondary | ICD-10-CM

## 2019-03-15 DIAGNOSIS — Z86010 Personal history of colonic polyps: Secondary | ICD-10-CM

## 2019-03-15 MED ORDER — PEG 3350-KCL-NA BICARB-NACL 420 G PO SOLR: ORAL | 0 refills | Status: DC

## 2019-03-15 NOTE — Telephone Encounter (Signed)
Called patient. He is scheduled for TCS with [facility] with mac on 4/6 at 12:00pm. Patient aware will mail pre-op with covid testing appt. Confirmed address. Patient aware will send Rx to pharmacy. Patient reports insurance will still be [employer] next year. Orders entered.

## 2019-03-15 NOTE — Addendum Note (Signed)
Addended by: Gordy Levan, Sadhana Frater A on: 03/15/2019 04:01 PM   Modules accepted: Level of Service

## 2019-03-15 NOTE — Patient Instructions (Signed)
Your health issues we discussed today were:   Need for a repeat colonoscopy: 1. You are currently due for your next colonoscopy 2. We will work to schedule this for you 3. As we discussed, we will use slightly stronger sleeping medicine to ensure that you are comfortable during the procedure 4. Call us if you have any problems with scheduling or if you need to reschedule  Overall I recommend:  1. Continue your other current medications 2. Return for follow-up as needed for GI symptoms or based on recommendations made after your colonoscopy 3. Call us if you have any questions or concerns   Because of recent events of COVID-19 ("Coronavirus"), follow CDC recommendations:  1. Wash your hand frequently 2. Avoid touching your face 3. Stay away from people who are sick 4. If you have symptoms such as fever, cough, shortness of breath then call your healthcare provider for further guidance 5. If you are sick, STAY AT HOME unless otherwise directed by your healthcare provider. 6. Follow directions from state and national officials regarding staying safe   At [facility] we value your feedback. You may receive a survey about your visit today. Please share your experience as we strive to create trusting relationships with our patients to provide genuine, compassionate, quality care.  We appreciate your understanding and patience as we review any laboratory studies, imaging, and other diagnostic tests that are ordered as we care for you. Our office policy is 5 business days for review of these results, and any emergent or urgent results are addressed in a timely manner for your best interest. If you do not hear from our office in 1 week, please contact us.   We also encourage the use of MyChart, which contains your medical information for your review as well. If you are not enrolled in this feature, an access code is on this after visit summary for your convenience. Thank you for  allowing [place] to be involved in your care.  It was great to see you today!  I hope you have a Happy New Year!!

## 2019-03-15 NOTE — Assessment & Plan Note (Signed)
Currently due for 3-year recall colonoscopy.  Last completed in December 2017 which found tubular adenomas and recommended 3-year repeat.  Today he is without any overt GI symptoms.  Office visit was this necessary due to need for propofol/MAC due to current medications.  At this point we will proceed with colonoscopy.  Proceed with colonoscopy on propofol/MAC with Dr. Oneida Alar in the near future. The risks, benefits, and alternatives have been discussed in detail with the patient. They state understanding and desire to proceed.   The patient is currently on Percocet as per HPI.  No other anticoagulants, anxiolytics, chronic pain medications, antidepressants, antidiabetics, or iron supplements.  We will plan for the procedure on propofol/MAC department adequate sedation.

## 2019-03-15 NOTE — Progress Notes (Addendum)
REVIEWED-NO ADDITIONAL RECOMMENDATIONS.  Referring Provider: Redmond School, MD Primary Care Physician:  Redmond School, MD Primary GI:  Dr. Oneida Alar  NOTE: Service was provided via telemedicine and was requested by the patient due to COVID-19 pandemic.  Method of visit: Telephone  Patient Location: Home  Provider Location: Office  Reason for Phone Visit: Schedule colonoscopy with MAC  The patient was consented to phone follow-up via telephone encounter including billing of the encounter (yes/no): Yes  Persons present on the phone encounter, with roles: None  Total time (minutes) spent on medical discussion: 22 minutes  Chief Complaint  Patient presents with  . Colonoscopy    consult, doing ok    HPI:   Mitchell Ross is a 63 y.o. male who presents for virtual visit regarding: Scheduling a colonoscopy.  The patient was last seen in our office 11/26/2016 for rectal bleeding.  At that time noted 2 to 3 weeks prior with light to heavy bleeding with every day occurrence.  No rectal pressure, pain, itching, burning, or swallowing.  Occasional constipation on opioids but otherwise regular stools.  Possibly some straining.  Has a cutting sensation when stool passes if it is large.  Recommended hemorrhoid cream 4 times a day for 14 days, high-fiber diet, avoid straining and constipation, milk of magnesia pills or liquid or Dulcolax 5 mg daily.  Recommended flexible sigmoidoscopy with hemorrhoid banding on September 27.  Follow-up in 4 months.  Flexible sigmoidoscopy was completed 12/11/2016 which found nonbleeding internal hemorrhoids status post banding, external hemorrhoids, diverticulosis in the sigmoid and descending colon.  Recommended high-fiber diet, continue medications, follow-up in 4 months.  The patient did not follow-up as recommended.  The patient underwent nurse/phone triage with last colonoscopy 03/03/2016 which found tubular adenoma and recommended 3-year recall.  Drinks  2-3 beers a week.  Takes Percocet as needed.  Review of state controlled substances none indicates he received 150 Percocet every 2 to 3 months.  Recommended office visit due to [place] for medications.  Today he states he's doing well overall. Denies abdominal pain, N/V, hematochezia, melena, fever, chills, unintentional weight loss. Still takes Percocet. Denies URI or flu-like symptoms. Denies loss of sense of taste or smell. Denies chest pain, dyspnea, dizziness, lightheadedness, syncope, near syncope. Denies any other upper or lower GI symptoms.  Past Medical History:  Diagnosis Date  . Arthritis   . Concussion with brief LOC 05/27/12  . Hepatitis C    2016 VL UNDETECTABLE  . High cholesterol   . History of prostate biopsy    states it was negative  . Neck injury 05/27/12    Past Surgical History:  Procedure Laterality Date  . ANTERIOR CERVICAL DECOMP/DISCECTOMY FUSION N/A 01/20/2013   Procedure: ANTERIOR CERVICAL DECOMPRESSION/DISCECTOMY FUSION 1 LEVEL;  Surgeon: Erline Levine, MD;  Location: [facility];  Service: Neurosurgery;  Laterality: N/A;  C3-4 Anterior cervical decompression/diskectomy/fusion  . BIOPSY  03/03/2016   Procedure: BIOPSY;  Surgeon: Danie Binder, MD;  [facility];  Service: Endoscopy;;  polypectomy by biopsy at ascending colon  . COLONOSCOPY    . COLONOSCOPY N/A 03/03/2016   Procedure: COLONOSCOPY;  Surgeon: Danie Binder, MD;  Location: [facility];  Service: Endoscopy;  Laterality: N/A;  8:30 Am  . FINGER SURGERY     tip of finger amputation  . FLEXIBLE SIGMOIDOSCOPY N/A 12/11/2016   Procedure: FLEXIBLE SIGMOIDOSCOPY;  Surgeon: Danie Binder, MD;  Location: [facility];  Service: Endoscopy;  Laterality: N/A;  730   . HEMORRHOID BANDING N/A 12/11/2016   Procedure: Thayer Jew;  Surgeon: Danie Binder, MD;  Location: [facility];  Service: Endoscopy;  Laterality: N/A;  . POLYPECTOMY  03/03/2016   Procedure: POLYPECTOMY;  Surgeon: Danie Binder, MD;  Location: [facility];  Service: Endoscopy;;  transverse x3; rectal and sigmoid polyps;  . torn right knee cartiledge  1970's    Current Outpatient Medications  Medication Sig Dispense Refill  . amLODipine (NORVASC) 10 MG tablet Take 10 mg by mouth daily.    Marland Kitchen aspirin 325 MG tablet Take 325 mg by mouth daily.     . COD LIVER OIL PO Take by mouth daily.    . Lycopene 10 MG CAPS Take 10 mg by mouth daily.    . Multiple Vitamins-Minerals (CENTRUM SILVER PO) Take 1 tablet by mouth daily.    . Multiple Vitamins-Minerals (ZINC PO) Take by mouth daily.    . Omega-3 Fatty Acids (FISH OIL PO) Take 1 tablet by mouth daily.    Marland Kitchen oxyCODONE-acetaminophen (PERCOCET) 10-325 MG per tablet Take 1 tablet by mouth as needed for pain.     . pravastatin (PRAVACHOL) 20 MG tablet Take 20 mg by mouth daily.     . Saw Palmetto, Serenoa repens, (SAW PALMETTO PO) Take 1,800 mg by mouth daily.    . SELENIUM PO Take by mouth daily.    . Tetrahydrozoline HCl (VISINE OP) Place 2 drops into both eyes daily as needed (dry eyes).    . TURMERIC PO Take 1 tablet by mouth daily.     Current Facility-Administered Medications  Medication Dose Route Frequency Provider Last Rate Last Admin  . triamcinolone acetonide (KENALOG) 10 MG/ML injection 10 mg  10 mg Other Once Wallene Huh, DPM        Allergies as of 03/15/2019  . (No Known Allergies)    Family History  Problem Relation Age of Onset  . Stomach cancer Mother   . Prostate cancer Father   . Cancer Unknown   . Arthritis Unknown   . Colon cancer Neg Hx     Social History   Socioeconomic History  . Marital status: Divorced    Spouse name: Not on file  . Number of children: Not on file  . Years of education: Not on file  . Highest education level: Not on file  Occupational History  . Not on file  Tobacco Use  . Smoking status: Current Every Day Smoker    Packs/day: 0.50    Years: 30.00    Pack years: 15.00    Types: Cigarettes  .  Smokeless tobacco: Never Used  . Tobacco comment: 1/2 pack a day  Substance and Sexual Activity  . Alcohol use: Yes    Alcohol/week: 0.0 standard drinks    Comment: 6 pack beer/week  . Drug use: No  . Sexual activity: Not on file  Other Topics Concern  . Not on file  Social History Narrative  . Not on file   Social Determinants of Health   Financial Resource Strain:   . Difficulty of Paying Living Expenses: Not on file  Food Insecurity:   . Worried About Charity fundraiser in the Last Year: Not on file  . Ran Out of Food in the Last Year: Not on file  Transportation Needs:   . Lack of Transportation (Medical): Not on file  . Lack of Transportation (Non-Medical): Not on file  Physical Activity:   .  Days of Exercise per Week: Not on file  . Minutes of Exercise per Session: Not on file  Stress:   . Feeling of Stress : Not on file  Social Connections:   . Frequency of Communication with Friends and Family: Not on file  . Frequency of Social Gatherings with Friends and Family: Not on file  . Attends Religious Services: Not on file  . Active Member of Clubs or Organizations: Not on file  . Attends Archivist Meetings: Not on file  . Marital Status: Not on file    Review of Systems: General: Negative for anorexia, weight loss, fever, chills, fatigue, weakness. ENT: Negative for hoarseness, difficulty swallowing. CV: Negative for chest pain, angina, palpitations, peripheral edema.  Respiratory: Negative for dyspnea at rest, cough, sputum, wheezing.  GI: See history of present illness. Endo: Negative for unusual weight change.  Heme: Negative for bruising or bleeding. Allergy: Negative for rash or hives.  Physical Exam: Note: limited exam due to virtual visit General:   Alert and oriented. Pleasant and cooperative. Ears:  Normal auditory acuity. Neurologic:  Alert and oriented x4 Psych:  Alert and cooperative. Normal mood and affect.

## 2019-03-16 ENCOUNTER — Encounter: Payer: Self-pay | Admitting: *Deleted

## 2019-04-05 ENCOUNTER — Telehealth: Payer: Self-pay

## 2019-04-05 NOTE — Telephone Encounter (Signed)
Pre-op and COVID test rescheduled to 06/20/19 d/t 06/17/19 is a holiday. New appt letter mailed.

## 2019-04-08 DIAGNOSIS — N400 Enlarged prostate without lower urinary tract symptoms: Secondary | ICD-10-CM | POA: Diagnosis not present

## 2019-04-08 DIAGNOSIS — M1991 Primary osteoarthritis, unspecified site: Secondary | ICD-10-CM | POA: Diagnosis not present

## 2019-04-08 DIAGNOSIS — G894 Chronic pain syndrome: Secondary | ICD-10-CM | POA: Diagnosis not present

## 2019-04-08 DIAGNOSIS — Z6832 Body mass index (BMI) 32.0-32.9, adult: Secondary | ICD-10-CM | POA: Diagnosis not present

## 2019-05-21 ENCOUNTER — Ambulatory Visit: Payer: Self-pay

## 2019-05-28 ENCOUNTER — Ambulatory Visit: Payer: BC Managed Care – PPO

## 2019-06-14 ENCOUNTER — Telehealth: Payer: Self-pay | Admitting: Internal Medicine

## 2019-06-14 NOTE — Telephone Encounter (Signed)
Called pt. He is aware will do miralax prep. He will come by the office to pick up new instructions.

## 2019-06-14 NOTE — Telephone Encounter (Signed)
Patient pharmacy called to say they are out of the prep that was sent in , please send an alternative

## 2019-06-16 ENCOUNTER — Encounter (HOSPITAL_COMMUNITY): Payer: Self-pay

## 2019-06-17 ENCOUNTER — Other Ambulatory Visit (HOSPITAL_COMMUNITY): Payer: BC Managed Care – PPO

## 2019-06-20 ENCOUNTER — Other Ambulatory Visit: Payer: Self-pay

## 2019-06-20 ENCOUNTER — Other Ambulatory Visit (HOSPITAL_COMMUNITY)
Admission: RE | Admit: 2019-06-20 | Discharge: 2019-06-20 | Disposition: A | Discharge status: Home / Self Care | Payer: BC Managed Care – PPO | Source: Ambulatory Visit | Attending: Gastroenterology | Admitting: Gastroenterology

## 2019-06-20 ENCOUNTER — Encounter (HOSPITAL_COMMUNITY)
Admission: RE | Admit: 2019-06-20 | Discharge: 2019-06-20 | Disposition: A | Discharge status: Home / Self Care | Payer: BC Managed Care – PPO | Source: Ambulatory Visit | Attending: Gastroenterology | Admitting: Gastroenterology

## 2019-06-20 DIAGNOSIS — Z20822 Contact with and (suspected) exposure to covid-19: Secondary | ICD-10-CM | POA: Insufficient documentation

## 2019-06-20 DIAGNOSIS — Z01812 Encounter for preprocedural laboratory examination: Secondary | ICD-10-CM | POA: Insufficient documentation

## 2019-06-20 HISTORY — DX: Essential (primary) hypertension: I10

## 2019-06-20 LAB — COMPREHENSIVE METABOLIC PANEL
ALT: 25 U/L (ref 0–44)
AST: 23 U/L (ref 15–41)
Albumin: 3.9 g/dL (ref 3.5–5.0)
Alkaline Phosphatase: 64 U/L (ref 38–126)
Anion gap: 8 (ref 5–15)
BUN: 15 mg/dL (ref 8–23)
CO2: 23 mmol/L (ref 22–32)
Calcium: 9.1 mg/dL (ref 8.9–10.3)
Chloride: 108 mmol/L (ref 98–111)
Creatinine, Ser: 0.98 mg/dL (ref 0.61–1.24)
GFR calc Af Amer: >60 mL/min (ref >60)
GFR calc non Af Amer: >60 mL/min (ref >60)
Glucose, Bld: 93 mg/dL (ref 70–99)
Potassium: 4 mmol/L (ref 3.5–5.1)
Sodium: 139 mmol/L (ref 135–145)
Total Bilirubin: 0.6 mg/dL (ref 0.3–1.2)
Total Protein: 7.7 g/dL (ref 6.5–8.1)

## 2019-06-20 LAB — PROTIME-INR
INR: 1 (ref 0.8–1.2)
Prothrombin Time: 12.7 seconds (ref 11.4–15.2)

## 2019-06-20 LAB — SARS CORONAVIRUS 2 (TAT 6-24 HRS): SARS Coronavirus 2: NEGATIVE

## 2019-06-20 NOTE — Patient Instructions (Addendum)
Mitchell Ross  06/20/2019     @PREFPERIOPPHARMACY @   Your procedure is scheduled on 06/21/2019.  Report to [name] at 10:30 A.M.  Call this number if you have problems the morning of surgery:  [phone]   Remember:  Do not eat or drink after midnight.     Take these medicines the morning of surgery with A SIP OF WATER Amlodipine    Do not wear jewelry, make-up or nail polish.  Do not wear lotions, powders, or perfumes, or deodorant.  Do not shave 48 hours prior to surgery.  Men may shave face and neck.  Do not bring valuables to the hospital.  [facility] is not responsible for any belongings or valuables.  Contacts, dentures or bridgework may not be worn into surgery.  Leave your suitcase in the car.  After surgery it may be brought to your room.  For patients admitted to the hospital, discharge time will be determined by your treatment team.  Patients discharged the day of surgery will not be allowed to drive home.   Name and phone number of your driver:   family Special instructions:  n/a  Please read over the following fact sheets that you were given. Care and Recovery After Surgery       Colonoscopy, Adult A colonoscopy is a procedure to look at the entire large intestine. This procedure is done using a long, thin, flexible tube that has a camera on the end. You may have a colonoscopy:  As a part of normal colorectal screening.  If you have certain symptoms, such as: ? A low number of red blood cells in your blood (anemia). ? Diarrhea that does not go away. ? Pain in your abdomen. ? Blood in your stool. A colonoscopy can help screen for and diagnose medical problems, including:  Tumors.  Extra tissue that grows where mucus forms (polyps).  Inflammation.  Areas of bleeding. Tell your health care provider about:  Any allergies you have.  All medicines you are taking, including vitamins, herbs, eye drops, creams, and over-the-counter  medicines.  Any problems you or family members have had with anesthetic medicines.  Any blood disorders you have.  Any surgeries you have had.  Any medical conditions you have.  Any problems you have had with having bowel movements.  Whether you are pregnant or may be pregnant. What are the risks? Generally, this is a safe procedure. However, problems may occur, including:  Bleeding.  Damage to your intestine.  Allergic reactions to medicines given during the procedure.  Infection. This is rare. What happens before the procedure? Eating and drinking restrictions Follow instructions from your health care provider about eating or drinking restrictions, which may include:  A few days before the procedure: ? Follow a low-fiber diet. ? Avoid nuts, seeds, dried fruit, raw fruits, and vegetables.  1-3 days before the procedure: ? Eat only gelatin dessert or ice pops. ? Drink only clear liquids, such as water, clear juice, clear broth or bouillon, black coffee or tea, or clear soft drinks or sports drinks. ? Avoid liquids that contain red or purple dye.  The day of the procedure: ? Do not eat solid foods. You may continue to drink clear liquids until up to 2 hours before the procedure. ? Do not eat or drink anything starting 2 hours before the procedure, or within the time period that your health care provider recommends. Bowel prep If you were prescribed a bowel prep to take  by mouth (orally) to clean out your colon:  Take it as told by your health care provider. Starting the day before your procedure, you will need to drink a large amount of liquid medicine. The liquid will cause you to have many bowel movements of loose stool until your stool becomes almost clear or light green.  If your skin or the opening between the buttocks (anus) gets irritated from diarrhea, you may relieve the irritation using: ? Wipes with medicine in them, such as adult wet wipes with aloe and vitamin  E. ? A product to soothe skin, such as petroleum jelly.  If you vomit while drinking the bowel prep: ? Take a break for up to 60 minutes. ? Begin the bowel prep again. ? Call your health care provider if you keep vomiting or you cannot take the bowel prep without vomiting.  To clean out your colon, you may also be given: ? Laxative medicines. These help you have a bowel movement. ? Instructions for enema use. An enema is liquid medicine injected into your rectum. Medicines Ask your health care provider about:  Changing or stopping your regular medicines or supplements. This is especially important if you are taking iron supplements, diabetes medicines, or blood thinners.  Taking medicines such as aspirin and ibuprofen. These medicines can thin your blood. Do not take these medicines unless your health care provider tells you to take them.  Taking over-the-counter medicines, vitamins, herbs, and supplements. General instructions  Ask your health care provider what steps will be taken to help prevent infection. These may include washing skin with a germ-killing soap.  Plan to have someone take you home from the hospital or clinic. What happens during the procedure?   An IV will be inserted into one of your veins.  You may be given one or more of the following: ? A medicine to help you relax (sedative). ? A medicine to numb the area (local anesthetic). ? A medicine to make you fall asleep (general anesthetic). This is rarely needed.  You will lie on your side with your knees bent.  The tube will: ? Have oil or gel put on it (be lubricated). ? Be inserted into your anus. ? Be gently eased through all parts of your large intestine.  Air will be sent into your colon to keep it open. This may cause some pressure or cramping.  Images will be taken with the camera and will appear on a screen.  A small tissue sample may be removed to be looked at under a microscope (biopsy). The  tissue may be sent to a lab for testing if any signs of problems are found.  If small polyps are found, they may be removed and checked for cancer cells.  When the procedure is finished, the tube will be removed. The procedure may vary among health care providers and hospitals. What happens after the procedure?  Your blood pressure, heart rate, breathing rate, and blood oxygen level will be monitored until you leave the hospital or clinic.  You may have a small amount of blood in your stool.  You may pass gas and have mild cramping or bloating in your abdomen. This is caused by the air that was used to open your colon during the exam.  Do not drive for 24 hours after the procedure.  It is up to you to get the results of your procedure. Ask your health care provider, or the department that is doing the procedure, when  your results will be ready. Summary  A colonoscopy is a procedure to look at the entire large intestine.  Follow instructions from your health care provider about eating and drinking before the procedure.  If you were prescribed an oral bowel prep to clean out your colon, take it as told by your health care provider.  During the colonoscopy, a flexible tube with a camera on its end is inserted into the anus and then passed into the other parts of the large intestine. This information is not intended to replace advice given to you by your health care provider. Make sure you discuss any questions you have with your health care provider. Document Revised: 09/24/2018 Document Reviewed: 09/24/2018 Elsevier Patient Education  [place].

## 2019-06-21 ENCOUNTER — Ambulatory Visit (HOSPITAL_COMMUNITY)
Admission: RE | Admit: 2019-06-21 | Discharge: 2019-06-21 | Disposition: A | Discharge status: Home / Self Care | Payer: BC Managed Care – PPO | Attending: Gastroenterology | Admitting: Gastroenterology

## 2019-06-21 ENCOUNTER — Other Ambulatory Visit: Payer: Self-pay

## 2019-06-21 ENCOUNTER — Encounter (HOSPITAL_COMMUNITY): Payer: Self-pay | Admitting: Gastroenterology

## 2019-06-21 ENCOUNTER — Ambulatory Visit (HOSPITAL_COMMUNITY): Payer: BC Managed Care – PPO | Admitting: Anesthesiology

## 2019-06-21 ENCOUNTER — Encounter (HOSPITAL_COMMUNITY)
Admission: RE | Disposition: A | Discharge status: Home / Self Care | Payer: Self-pay | Source: Home / Self Care | Attending: Gastroenterology

## 2019-06-21 DIAGNOSIS — M1990 Unspecified osteoarthritis, unspecified site: Secondary | ICD-10-CM | POA: Insufficient documentation

## 2019-06-21 DIAGNOSIS — D122 Benign neoplasm of ascending colon: Secondary | ICD-10-CM | POA: Insufficient documentation

## 2019-06-21 DIAGNOSIS — Z860100 Personal history of colon polyps, unspecified: Secondary | ICD-10-CM

## 2019-06-21 DIAGNOSIS — K648 Other hemorrhoids: Secondary | ICD-10-CM | POA: Insufficient documentation

## 2019-06-21 DIAGNOSIS — F17210 Nicotine dependence, cigarettes, uncomplicated: Secondary | ICD-10-CM | POA: Diagnosis not present

## 2019-06-21 DIAGNOSIS — Z7982 Long term (current) use of aspirin: Secondary | ICD-10-CM | POA: Insufficient documentation

## 2019-06-21 DIAGNOSIS — Z09 Encounter for follow-up examination after completed treatment for conditions other than malignant neoplasm: Secondary | ICD-10-CM | POA: Diagnosis not present

## 2019-06-21 DIAGNOSIS — Z79899 Other long term (current) drug therapy: Secondary | ICD-10-CM | POA: Diagnosis not present

## 2019-06-21 DIAGNOSIS — K644 Residual hemorrhoidal skin tags: Secondary | ICD-10-CM | POA: Insufficient documentation

## 2019-06-21 DIAGNOSIS — Q438 Other specified congenital malformations of intestine: Secondary | ICD-10-CM | POA: Insufficient documentation

## 2019-06-21 DIAGNOSIS — K635 Polyp of colon: Secondary | ICD-10-CM | POA: Diagnosis not present

## 2019-06-21 DIAGNOSIS — E7800 Pure hypercholesterolemia, unspecified: Secondary | ICD-10-CM | POA: Insufficient documentation

## 2019-06-21 DIAGNOSIS — K5730 Diverticulosis of large intestine without perforation or abscess without bleeding: Secondary | ICD-10-CM | POA: Insufficient documentation

## 2019-06-21 DIAGNOSIS — I10 Essential (primary) hypertension: Secondary | ICD-10-CM | POA: Insufficient documentation

## 2019-06-21 HISTORY — PX: POLYPECTOMY: SHX5525

## 2019-06-21 HISTORY — PX: COLONOSCOPY WITH PROPOFOL: SHX5780

## 2019-06-21 SURGERY — COLONOSCOPY WITH PROPOFOL
Anesthesia: General

## 2019-06-21 MED ORDER — LACTATED RINGERS IV SOLN
Freq: Once | INTRAVENOUS | Status: AC
  Administered 2019-06-21: 1000 mL via INTRAVENOUS

## 2019-06-21 MED ORDER — FENTANYL CITRATE (PF) 100 MCG/2ML IJ SOLN
INTRAMUSCULAR | Status: AC
  Filled 2019-06-21: qty 2

## 2019-06-21 MED ORDER — LIDOCAINE HCL (CARDIAC) PF 100 MG/5ML IV SOSY
PREFILLED_SYRINGE | INTRAVENOUS | Status: DC | PRN
  Administered 2019-06-21: 50 mg via INTRATRACHEAL

## 2019-06-21 MED ORDER — FENTANYL CITRATE (PF) 100 MCG/2ML IJ SOLN
INTRAMUSCULAR | Status: DC | PRN
  Administered 2019-06-21: 50 ug via INTRAVENOUS

## 2019-06-21 MED ORDER — CHLORHEXIDINE GLUCONATE CLOTH 2 % EX PADS: 6.0000 | MEDICATED_PAD | Freq: Once | CUTANEOUS | Status: DC

## 2019-06-21 MED ORDER — PROPOFOL 500 MG/50ML IV EMUL
INTRAVENOUS | Status: DC | PRN
  Administered 2019-06-21: 100 ug/kg/min via INTRAVENOUS

## 2019-06-21 MED ORDER — PROPOFOL 10 MG/ML IV BOLUS
INTRAVENOUS | Status: AC
  Filled 2019-06-21: qty 40

## 2019-06-21 MED ORDER — PROPOFOL 10 MG/ML IV BOLUS
INTRAVENOUS | Status: DC | PRN
  Administered 2019-06-21 (×2): 50 ug via INTRAVENOUS

## 2019-06-21 MED ORDER — LACTATED RINGERS IV SOLN: INTRAVENOUS | Status: DC | PRN

## 2019-06-21 NOTE — Op Note (Signed)
[facility] Patient Name: Mitchell Ross Procedure Date: 06/21/2019 10:32 AM MRN: [id number] Date of Birth: [birth year] Attending MD: Barney Drain MD, MD CSN: [id number] Age: 64 Admit Type: Outpatient Procedure:                Colonoscopy WITH COLD SNARE POLYPECTOMY Indications:              Personal history of colonic polyps Providers:                Barney Drain MD, MD, Janeece Riggers, RN, Randa Spike, Technician Referring MD:             Redmond School, MD Medicines:                Propofol per Anesthesia Complications:            No immediate complications. Estimated Blood Loss:     Estimated blood loss was minimal. Procedure:                Pre-Anesthesia Assessment:                           - Prior to the procedure, a History and Physical                            was performed, and patient medications and                            allergies were reviewed. The patient's tolerance of                            previous anesthesia was also reviewed. The risks                            and benefits of the procedure and the sedation                            options and risks were discussed with the patient.                            All questions were answered, and informed consent                            was obtained. Prior Anticoagulants: The patient has                            taken no previous anticoagulant or antiplatelet                            agents except for aspirin. ASA Grade Assessment: II                            - A patient with mild systemic disease. After  reviewing the risks and benefits, the patient was                            deemed in satisfactory condition to undergo the                            procedure. After obtaining informed consent, the                            colonoscope was passed under direct vision.                            Throughout the procedure, the patient's blood                             pressure, pulse, and oxygen saturations were                            monitored continuously. The [id number]                            scope was introduced through the anus and advanced                            to the the cecum, identified by appendiceal orifice                            and ileocecal valve. The colonoscopy was somewhat                            difficult due to a tortuous colon. Successful                            completion of the procedure was aided by                            straightening and shortening the scope to obtain                            bowel loop reduction and COLOWRAP. The patient                            tolerated the procedure well. The quality of the                            bowel preparation was good. The ileocecal valve,                            appendiceal orifice, and rectum were photographed. Scope In: 11:19:03 AM Scope Out: 11:36:08 AM Scope Withdrawal Time: 0 hours 12 minutes 28 seconds  Total Procedure Duration: 0 hours 17 minutes 5 seconds  Findings:      A 4 mm polyp was found in the mid ascending colon. The polyp was  sessile. The polyp was removed with a cold snare. Resection and       retrieval were complete.      Multiple small and large-mouthed diverticula were found in the       recto-sigmoid colon, sigmoid colon, descending colon and ascending colon.      External and internal hemorrhoids were found. The hemorrhoids were       moderate.      The recto-sigmoid colon, sigmoid colon and descending colon were       moderately tortuous. Impression:               - One 4 mm polyp in the mid ascending colon,                            removed with a cold snare. Resected and retrieved.                           - MODERATE Diverticulosis in the recto-sigmoid                            colon, in the sigmoid colon, in the descending                            colon and in the  ascending colon.                           - MODERATE External and internal hemorrhoids.                           - Tortuous colon. Moderate Sedation:      Per Anesthesia Care Recommendation:           - Patient has a contact number available for                            emergencies. The signs and symptoms of potential                            delayed complications were discussed with the                            patient. Return to normal activities tomorrow.                            Written discharge instructions were provided to the                            patient.                           - High fiber diet.                           - Continue present medications.                           - Await pathology results.                           -  Repeat colonoscopy in 5 years for surveillance. Procedure Code(s):        --- Professional ---                           [phone], Colonoscopy, flexible; with removal of                            tumor(s), polyp(s), or other lesion(s) by snare                            technique Diagnosis Code(s):        --- Professional ---                           K63.5, Polyp of colon                           K64.8, Other hemorrhoids                           Z86.010, Personal history of colonic polyps                           K57.30, Diverticulosis of large intestine without                            perforation or abscess without bleeding                           Q43.8, Other specified congenital malformations of                            intestine CPT copyright 2019 American [employer] Association. All rights reserved. The codes documented in this report are preliminary and upon coder review may  be revised to meet current compliance requirements. Barney Drain, MD Barney Drain MD, MD 06/21/2019 11:46:39 AM This report has been signed electronically. Number of Addenda: 0

## 2019-06-21 NOTE — Anesthesia Postprocedure Evaluation (Signed)
Anesthesia Post Note  Patient: Mitchell Ross  Procedure(s) Performed: COLONOSCOPY WITH PROPOFOL (N/A ) POLYPECTOMY  Patient location during evaluation: Endoscopy Anesthesia Type: General Level of consciousness: awake and alert Pain management: pain level controlled Vital Signs Assessment: post-procedure vital signs reviewed and stable Respiratory status: spontaneous breathing, nonlabored ventilation, respiratory function stable and patient connected to nasal cannula oxygen Cardiovascular status: blood pressure returned to baseline and stable Postop Assessment: no apparent nausea or vomiting Anesthetic complications: no     Last Vitals:  Vitals:   06/21/19 1038 06/21/19 1145  BP: 123/84 116/86  Pulse: 70 87  Resp:  20  Temp:  36.8 C  SpO2: 99% 97%    Last Pain:  Vitals:   06/21/19 1145  TempSrc:   PainSc: 0-No pain                 Talitha Givens

## 2019-06-21 NOTE — Anesthesia Preprocedure Evaluation (Signed)
Anesthesia Evaluation  Patient identified by MRN, date of birth, ID band Patient awake    Reviewed: Allergy & Precautions, NPO status , Patient's Chart, lab work & pertinent test results  History of Anesthesia Complications Negative for: history of anesthetic complications  Airway Mallampati: III  TM Distance: >3 FB Neck ROM: Full    Dental  (+) Teeth Intact, Dental Advisory Given   Pulmonary Current Smoker and Patient abstained from smoking.,    Pulmonary exam normal breath sounds clear to auscultation       Cardiovascular Exercise Tolerance: Good hypertension, Pt. on medications Normal cardiovascular exam Rhythm:Regular Rate:Normal     Neuro/Psych    GI/Hepatic negative GI ROS, (+) Hepatitis -, C  Endo/Other  negative endocrine ROS  Renal/GU negative Renal ROS  negative genitourinary   Musculoskeletal  (+) Arthritis ,   Abdominal   Peds  Hematology negative hematology ROS (+)   Anesthesia Other Findings   Reproductive/Obstetrics                             Anesthesia Physical Anesthesia Plan  ASA: II  Anesthesia Plan: General   Post-op Pain Management:    Induction: Intravenous  PONV Risk Score and Plan: 1 and Treatment may vary due to age or medical condition  Airway Management Planned: Nasal Cannula, Natural Airway and Simple Face Mask  Additional Equipment:   Intra-op Plan:   Post-operative Plan:   Informed Consent: I have reviewed the patients History and Physical, chart, labs and discussed the procedure including the risks, benefits and alternatives for the proposed anesthesia with the patient or authorized representative who has indicated his/her understanding and acceptance.     Dental advisory given  Plan Discussed with: CRNA and Surgeon  Anesthesia Plan Comments:         Anesthesia Quick Evaluation

## 2019-06-21 NOTE — H&P (Signed)
Primary Care Physician:  Redmond School, MD Primary Gastroenterologist:  Dr. Oneida Alar  Pre-Procedure History & Physical: HPI:  Mitchell Ross is a 64 y.o. male here for PERSONAL HISTORY OF POLYPS.  Past Medical History:  Diagnosis Date  . Arthritis   . Concussion with brief LOC 05/27/12  . Hepatitis C    2016 VL UNDETECTABLE  . High cholesterol   . History of prostate biopsy    states it was negative  . Hypertension   . Neck injury 05/27/12    Past Surgical History:  Procedure Laterality Date  . ANTERIOR CERVICAL DECOMP/DISCECTOMY FUSION N/A 01/20/2013   Procedure: ANTERIOR CERVICAL DECOMPRESSION/DISCECTOMY FUSION 1 LEVEL;  Surgeon: Erline Levine, MD;  Location: [facility];  Service: Neurosurgery;  Laterality: N/A;  C3-4 Anterior cervical decompression/diskectomy/fusion  . BIOPSY  03/03/2016   Procedure: BIOPSY;  Surgeon: Danie Binder, MD;  Location: [facility];  Service: Endoscopy;;  polypectomy by biopsy at ascending colon  . COLONOSCOPY    . COLONOSCOPY N/A 03/03/2016   Procedure: COLONOSCOPY;  Surgeon: Danie Binder, MD;  Location: [facility];  Service: Endoscopy;  Laterality: N/A;  8:30 Am  . FINGER SURGERY     tip of finger amputation  . FLEXIBLE SIGMOIDOSCOPY N/A 12/11/2016   Procedure: FLEXIBLE SIGMOIDOSCOPY;  Surgeon: Danie Binder, MD;  Location: [facility];  Service: Endoscopy;  Laterality: N/A;  730   . HEMORRHOID BANDING N/A 12/11/2016   Procedure: HEMORRHOID BANDING;  Surgeon: Danie Binder, MD;  Location: [facility];  Service: Endoscopy;  Laterality: N/A;  . POLYPECTOMY  03/03/2016   Procedure: POLYPECTOMY;  Surgeon: Danie Binder, MD;  Location: [facility];  Service: Endoscopy;;  transverse x3; rectal and sigmoid polyps;  . torn right knee cartiledge  1970's    Prior to Admission medications   Medication Sig Start Date End Date Taking? Authorizing Provider  amLODipine (NORVASC) 10 MG tablet Take 10 mg by mouth daily. 11/25/16  Yes  [provider]  aspirin 325 MG tablet Take 325 mg by mouth daily.    Yes [provider]  COD LIVER OIL PO Take 1 capsule by mouth daily.    Yes [provider]  Coenzyme Q10 (CO Q-10) 100 MG CAPS Take 100 mg by mouth daily.   Yes [provider]  Lycopene 10 MG CAPS Take 10 mg by mouth daily.   Yes [provider]  Multiple Vitamins-Minerals (CENTRUM SILVER PO) Take 1 tablet by mouth daily.   Yes [provider]  pravastatin (PRAVACHOL) 20 MG tablet Take 20 mg by mouth daily.    Yes [provider]  Saw Palmetto 450 MG CAPS Take 900 mg by mouth daily.   Yes [provider]  selenium 200 MCG TABS tablet Take 200 mcg by mouth daily.    Yes [provider]  Turmeric 500 MG TABS Take 500 mg by mouth daily.    Yes [provider]  zinc gluconate 50 MG tablet Take 50 mg by mouth daily.   Yes [provider]  polyethylene glycol-electrolytes (NULYTELY/GOLYTELY) 420 g solution As directed 03/15/19   Danie Binder, MD    Allergies as of 03/15/2019  . (No Known Allergies)    Family History  Problem Relation Age of Onset  . Stomach cancer Mother   . Prostate cancer Father   . Cancer Other   . Arthritis Other   . Colon cancer Neg Hx  Social History   Socioeconomic History  . Marital status: Divorced    Spouse name: Not on file  . Number of children: Not on file  . Years of education: Not on file  . Highest education level: Not on file  Occupational History  . Not on file  Tobacco Use  . Smoking status: Current Every Day Smoker    Packs/day: 0.20    Years: 30.00    Pack years: 6.00    Types: Cigarettes  . Smokeless tobacco: Never Used  . Tobacco comment: 1/2 pack a day  Substance and Sexual Activity  . Alcohol use: Yes    Alcohol/week: 0.0 standard drinks    Comment: 6 pack beer/week  . Drug use: No  . Sexual activity: Not on file  Other Topics Concern  . Not on file   Social History Narrative  . Not on file   Social Determinants of Health   Financial Resource Strain:   . Difficulty of Paying Living Expenses:   Food Insecurity:   . Worried About Charity fundraiser in the Last Year:   . Arboriculturist in the Last Year:   Transportation Needs:   . Film/video editor (Medical):   Marland Kitchen Lack of Transportation (Non-Medical):   Physical Activity:   . Days of Exercise per Week:   . Minutes of Exercise per Session:   Stress:   . Feeling of Stress :   Social Connections:   . Frequency of Communication with Friends and Family:   . Frequency of Social Gatherings with Friends and Family:   . Attends Religious Services:   . Active Member of Clubs or Organizations:   . Attends Archivist Meetings:   Marland Kitchen Marital Status:   Intimate Partner Violence:   . Fear of Current or Ex-Partner:   . Emotionally Abused:   Marland Kitchen Physically Abused:   . Sexually Abused:    Review of Systems: See HPI, otherwise negative ROS  Physical Exam: BP 123/84   Pulse 70   Temp 98.5 F (36.9 C) (Oral)   Resp 17   Ht 6\' 4"  (1.93 m)   Wt 102.1 kg   SpO2 99%   BMI 27.39 kg/m  General:   Alert,  pleasant and cooperative in NAD Head:  Normocephalic and atraumatic. Neck:  Supple; Lungs:  Clear throughout to auscultation.    Heart:  Regular rate and rhythm. Abdomen:  Soft, nontender and nondistended. Normal bowel sounds, without guarding, and without rebound.   Neurologic:  Alert and  oriented x4;  grossly normal neurologically.  Impression/Plan:     PERSONAL HISTORY OF POLYPS.  PLAN: 1. TCS TODAY. DISCUSSED PROCEDURE, BENEFITS, & RISKS: < 1% chance of medication reaction, bleeding, perforation, ASPIRATION, or rupture of spleen/liver requiring surgery to fix it and missed polyps < 1 cm 10-20% of the time.

## 2019-06-21 NOTE — Discharge Instructions (Signed)
You have small internal hemorrhoids and diverticulosis IN YOUR LEFT AND RIGHT COLON. YOU HAD ONE SMALL POLYP REMOVED.    DRINK WATER TO KEEP YOUR URINE LIGHT YELLOW.  FOLLOW A HIGH FIBER DIET. AVOID ITEMS THAT CAUSE BLOATING. See info below.   USE PREPARATION H FOUR TIMES  A DAY IF NEEDED TO RELIEVE RECTAL PAIN/PRESSURE/BLEEDING.   YOUR BIOPSY RESULTS WILL BE BACK IN 5 BUSINESS DAYS.  Next colonoscopy in 5 years.  Colonoscopy Care After Read the instructions outlined below and refer to this sheet in the next week. These discharge instructions provide you with general information on caring for yourself after you leave the hospital. While your treatment has been planned according to the most current medical practices available, unavoidable complications occasionally occur. If you have any problems or questions after discharge, call DR. FIELDS, [phone].  ACTIVITY  You may resume your regular activity, but move at a slower pace for the next 24 hours.   Take frequent rest periods for the next 24 hours.   Walking will help get rid of the air and reduce the bloated feeling in your belly (abdomen).   No driving for 24 hours (because of the medicine (anesthesia) used during the test).   You may shower.   Do not sign any important legal documents or operate any machinery for 24 hours (because of the anesthesia used during the test).    NUTRITION  Drink plenty of fluids.   You may resume your normal diet as instructed by your doctor.   Begin with a light meal and progress to your normal diet. Heavy or fried foods are harder to digest and may make you feel sick to your stomach (nauseated).   Avoid alcoholic beverages for 24 hours or as instructed.    MEDICATIONS  You may resume your normal medications.   WHAT YOU CAN EXPECT TODAY  Some feelings of bloating in the abdomen.   Passage of more gas than usual.   Spotting of blood in your stool or on the toilet paper  .    IF YOU HAD POLYPS REMOVED DURING THE COLONOSCOPY:  Eat a soft diet IF YOU HAVE NAUSEA, BLOATING, ABDOMINAL PAIN, OR VOMITING.    FINDING OUT THE RESULTS OF YOUR TEST Not all test results are available during your visit. DR. Oneida Alar WILL CALL YOU WITHIN 14 DAYS OF YOUR PROCEDUE WITH YOUR RESULTS. Do not assume everything is normal if you have not heard from DR. FIELDS, CALL HER OFFICE AT [phone].  SEEK IMMEDIATE MEDICAL ATTENTION AND CALL THE OFFICE: [phone] IF:  You have more than a spotting of blood in your stool.   Your belly is swollen (abdominal distention).   You are nauseated or vomiting.   You have a temperature over 101F.   You have abdominal pain or discomfort that is severe or gets worse throughout the day.  High-Fiber Diet A high-fiber diet changes your normal diet to include more whole grains, legumes, fruits, and vegetables. Changes in the diet involve replacing refined carbohydrates with unrefined foods. The calorie level of the diet is essentially unchanged. The Dietary Reference Intake (recommended amount) for adult males is 38 grams per day. For adult females, it is 25 grams per day. Pregnant and lactating women should consume 28 grams of fiber per day. Fiber is the intact part of a plant that is not broken down during digestion. Functional fiber is fiber that has been isolated from the plant to provide a beneficial effect in the body.  PURPOSE Increase stool bulk.  Ease and regulate bowel movements.  Lower cholesterol.  REDUCE RISK OF COLON CANCER  INDICATIONS THAT YOU NEED MORE FIBER Constipation and hemorrhoids.  Uncomplicated diverticulosis (intestine condition) and irritable bowel syndrome.  Weight management.  As a protective measure against hardening of the arteries (atherosclerosis), diabetes, and cancer.   GUIDELINES FOR INCREASING FIBER IN THE DIET Start adding fiber to the diet slowly. A gradual increase of about 5 more grams (2 servings of  most fruits or vegetables) per day is best. Too rapid an increase in fiber may result in constipation, flatulence, and bloating.  Drink enough water and fluids to keep your urine clear or pale yellow. Water, juice, or caffeine-free drinks are recommended. Not drinking enough fluid may cause constipation.  Eat a variety of high-fiber foods rather than one type of fiber.  Try to increase your intake of fiber through using high-fiber foods rather than fiber pills or supplements that contain small amounts of fiber.  The goal is to change the types of food eaten. Do not supplement your present diet with high-fiber foods, but replace foods in your present diet.     Polyps, Colon  A polyp is extra tissue that grows inside your body. Colon polyps grow in the large intestine. The large intestine, also called the colon, is part of your digestive system. It is a long, hollow tube at the end of your digestive tract where your body makes and stores stool. Most polyps are not dangerous. They are benign. This means they are not cancerous. But over time, some types of polyps can turn into cancer. Polyps that are smaller than a pea are usually not harmful. But larger polyps could someday become or may already be cancerous. To be safe, doctors remove all polyps and test them.   PREVENTION There is not one sure way to prevent polyps. You might be able to lower your risk of getting them if you:  Eat more fruits and vegetables and less fatty food.   Do not smoke.   Avoid alcohol.   Exercise every day.   Lose weight if you are overweight.   Eating more calcium and folate can also lower your risk of getting polyps. Some foods that are rich in calcium are milk, cheese, and broccoli. Some foods that are rich in folate are chickpeas, kidney beans, and spinach.    Diverticulosis Diverticulosis is a common condition that develops when small pouches (diverticula) form in the wall of the colon. The risk of  diverticulosis increases with age. It happens more often in people who eat a low-fiber diet. Most individuals with diverticulosis have no symptoms. Those individuals with symptoms usually experience belly (abdominal) pain, constipation, or loose stools (diarrhea).  HOME CARE INSTRUCTIONS  Increase the amount of fiber in your diet as directed by your caregiver or dietician. This may reduce symptoms of diverticulosis.   Drink at least 6 to 8 glasses of water each day to prevent constipation.   Try not to strain when you have a bowel movement.   Avoiding nuts and seeds to prevent complications is NOT NECESSARY.   FOODS HAVING HIGH FIBER CONTENT INCLUDE:  Fruits. Apple, peach, pear, tangerine, raisins, prunes.   Vegetables. Brussels sprouts, asparagus, broccoli, cabbage, carrot, cauliflower, romaine lettuce, spinach, summer squash, tomato, winter squash, zucchini.   Starchy Vegetables. Baked beans, kidney beans, lima beans, split peas, lentils, potatoes (with skin).    SEEK IMMEDIATE MEDICAL CARE IF:  You develop increasing pain or  severe bloating.   You have an oral temperature above 101F.   You develop vomiting or bowel movements that are bloody or black.       Monitored Anesthesia Care, Care After These instructions provide you with information about caring for yourself after your procedure. Your health care provider may also give you more specific instructions. Your treatment has been planned according to current medical practices, but problems sometimes occur. Call your health care provider if you have any problems or questions after your procedure. What can I expect after the procedure? After your procedure, you may:  Feel sleepy for several hours.  Feel clumsy and have poor balance for several hours.  Feel forgetful about what happened after the procedure.  Have poor judgment for several hours.  Feel nauseous or vomit.  Have a sore throat if you had a breathing tube  during the procedure. Follow these instructions at home: For at least 24 hours after the procedure:      Have a responsible adult stay with you. It is important to have someone help care for you until you are awake and alert.  Rest as needed.  Do not: ? Participate in activities in which you could fall or become injured. ? Drive. ? Use heavy machinery. ? Drink alcohol. ? Take sleeping pills or medicines that cause drowsiness. ? Make important decisions or sign legal documents. ? Take care of children on your own. Eating and drinking  Follow the diet that is recommended by your health care provider.  If you vomit, drink water, juice, or soup when you can drink without vomiting.  Make sure you have little or no nausea before eating solid foods. General instructions  Take over-the-counter and prescription medicines only as told by your health care provider.  If you have sleep apnea, surgery and certain medicines can increase your risk for breathing problems. Follow instructions from your health care provider about wearing your sleep device: ? Anytime you are sleeping, including during daytime naps. ? While taking prescription pain medicines, sleeping medicines, or medicines that make you drowsy.  If you smoke, do not smoke without supervision.  Keep all follow-up visits as told by your health care provider. This is important. Contact a health care provider if:  You keep feeling nauseous or you keep vomiting.  You feel light-headed.  You develop a rash.  You have a fever. Get help right away if:  You have trouble breathing. Summary  For several hours after your procedure, you may feel sleepy and have poor judgment.  Have a responsible adult stay with you for at least 24 hours or until you are awake and alert. This information is not intended to replace advice given to you by your health care provider. Make sure you discuss any questions you have with your health care  provider. Document Revised: 06/01/2017 Document Reviewed: 06/24/2015 Elsevier Patient Education  [place].

## 2019-06-21 NOTE — Transfer of Care (Signed)
Immediate Anesthesia Transfer of Care Note  Patient: Mitchell Ross  Procedure(s) Performed: COLONOSCOPY WITH PROPOFOL (N/A ) POLYPECTOMY  Patient Location: PACU  Anesthesia Type:General  Level of Consciousness: awake, alert  and oriented  Airway & Oxygen Therapy: Patient Spontanous Breathing  Post-op Assessment: Report given to RN, Post -op Vital signs reviewed and stable and Patient moving all extremities X 4  Post vital signs: Reviewed and stable  Last Vitals:  Vitals Value Taken Time  BP 116/86 06/21/19 1147  Temp 36.8 C 06/21/19 1145  Pulse 80 06/21/19 1148  Resp 26 06/21/19 1148  SpO2 94 % 06/21/19 1148  Vitals shown include unvalidated device data.  Last Pain:  Vitals:   06/21/19 1031  TempSrc: Oral  PainSc: 0-No pain      Patients Stated Pain Goal: 6 ([id number])  Complications: No apparent anesthesia complications

## 2019-06-22 LAB — SURGICAL PATHOLOGY

## 2019-07-06 DIAGNOSIS — G894 Chronic pain syndrome: Secondary | ICD-10-CM | POA: Diagnosis not present

## 2019-07-06 DIAGNOSIS — Z6831 Body mass index (BMI) 31.0-31.9, adult: Secondary | ICD-10-CM | POA: Diagnosis not present

## 2019-07-06 DIAGNOSIS — N400 Enlarged prostate without lower urinary tract symptoms: Secondary | ICD-10-CM | POA: Diagnosis not present

## 2019-07-06 DIAGNOSIS — Z1389 Encounter for screening for other disorder: Secondary | ICD-10-CM | POA: Diagnosis not present

## 2019-07-06 DIAGNOSIS — M1991 Primary osteoarthritis, unspecified site: Secondary | ICD-10-CM | POA: Diagnosis not present

## 2020-02-23 ENCOUNTER — Other Ambulatory Visit: Payer: Self-pay | Admitting: Urology

## 2020-02-23 DIAGNOSIS — R972 Elevated prostate specific antigen [PSA]: Secondary | ICD-10-CM

## 2020-03-22 ENCOUNTER — Ambulatory Visit
Admission: RE | Admit: 2020-03-22 | Discharge: 2020-03-22 | Disposition: A | Discharge status: Home / Self Care | Payer: BC Managed Care – PPO | Source: Ambulatory Visit | Attending: Urology | Admitting: Urology

## 2020-03-22 DIAGNOSIS — R972 Elevated prostate specific antigen [PSA]: Secondary | ICD-10-CM

## 2020-03-22 MED ORDER — GADOBENATE DIMEGLUMINE 529 MG/ML IV SOLN
20.0000 mL | Freq: Once | INTRAVENOUS | Status: AC | PRN
  Administered 2020-03-22: 20 mL via INTRAVENOUS

## 2021-03-05 IMAGING — MR MR PROSTATE WO/W CM
12 series · 48 of 48 positions shown · IV contrast (Multihance 20mL)
Comparison: None.

CLINICAL DATA: Elevated PSA. Prior negative biopsy. Strong family
history of prostate cancer. PSA of 3.59.

EXAM:
MR PROSTATE WITHOUT AND WITH CONTRAST
TECHNIQUE: Multiplanar multisequence MRI images were obtained of the pelvis
centered about the prostate. Pre and post contrast images were
obtained.
CONTRAST:  20mL MULTIHANCE GADOBENATE DIMEGLUMINE 529 MG/ML IV SOLN

[Series 3: T2 · coronal · 3.0mm · 0.70mm/px · 1 of 25 slices shown (1 of 3)]
[im 1/25]
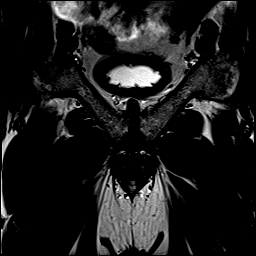

[Series 4: T1 · axial · 5.0mm · 1.25mm/px · 1 of 96 slices shown]
[im 1/96]
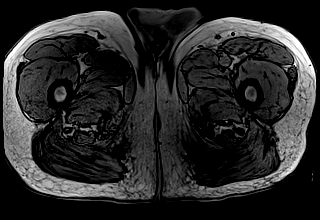

[Series 5: DWI · axial · 3.0mm · 1.75mm/px · 1 of 75 slices shown (1 of 3)]
[im 1/75]
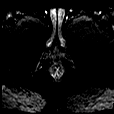

[Series 6: DWI · axial · 3.0mm · 1.75mm/px · 1 of 25 slices shown (2 of 3)]
[im 1/25]
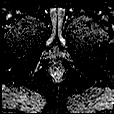

[Series 7: DWI · axial · 3.0mm · 1.75mm/px · 1 of 25 slices shown (3 of 3)]
[im 1/25]
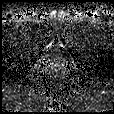

[Series 8: T2 · axial · 3.0mm · 0.70mm/px · 1 of 25 slices shown (2 of 3)]
[im 1/25]
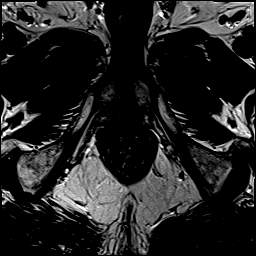

[Series 9: T2 · axial · 1.0mm · 1.04mm/px · z∈[-56,+31]mm · 2 of 88 slices shown (3 of 3)]
[im 1/88]
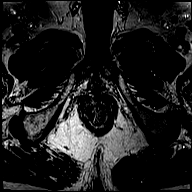
[im 88/88]
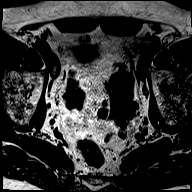

[Series 10: pre t1_twist_tra_dyn · axial · non-contrast · 3.5mm · 0.83mm/px · 1 of 24 slices shown]
[im 1/24]
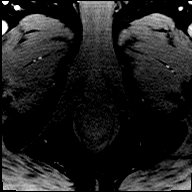

[Series 11: post t1_twist_tra_dyn-copy center · axial · non-contrast · 3.5mm · 0.83mm/px · z∈[-63,+18]mm · 18 of 720 slices shown]
[im 1/720]
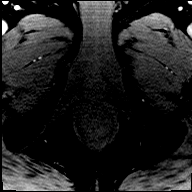
[im 43/720]
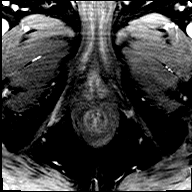
[im 85/720]
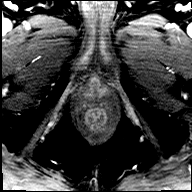
[im 127/720]
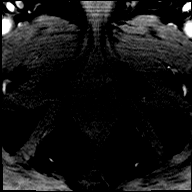
[im 170/720]
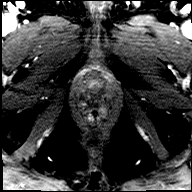
[im 212/720]
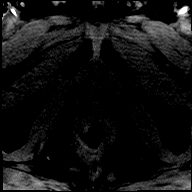
[im 254/720]
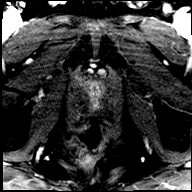
[im 297/720]
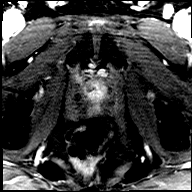
[im 339/720]
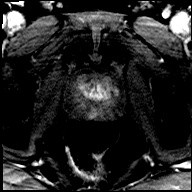
[im 381/720]
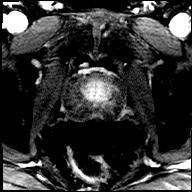
[im 423/720]
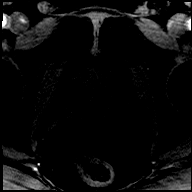
[im 466/720]
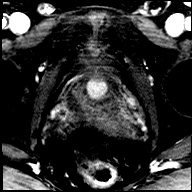
[im 508/720]
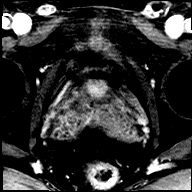
[im 550/720]
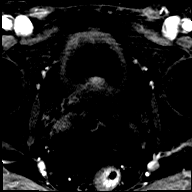
[im 593/720]
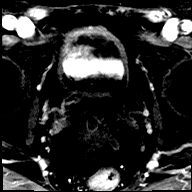
[im 635/720]
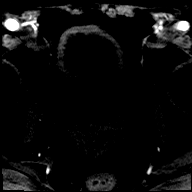
[im 677/720]
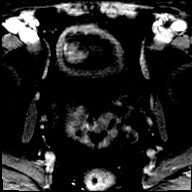
[im 720/720]
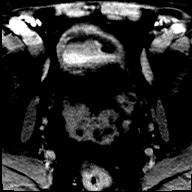

[Series 12: post t1_twist_tra_dyn-copy cent_sub · axial · 3.5mm · 0.83mm/px · z∈[-63,+18]mm · 17 of 695 slices shown]
[im 1/695]
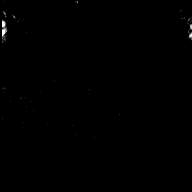
[im 44/695]
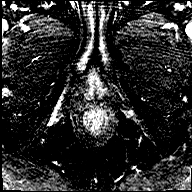
[im 87/695]
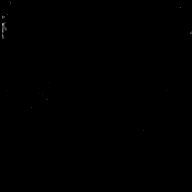
[im 131/695]
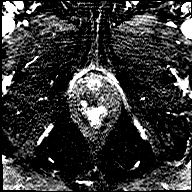
[im 174/695]
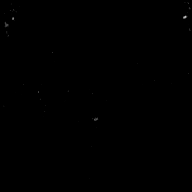
[im 217/695]
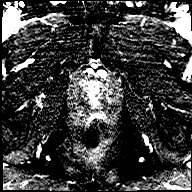
[im 261/695]
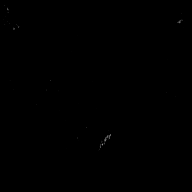
[im 304/695]
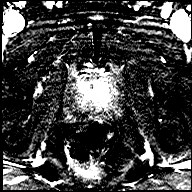
[im 348/695]
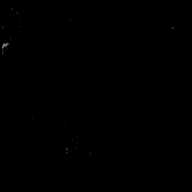
[im 391/695]
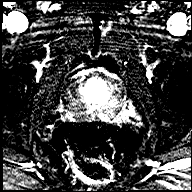
[im 434/695]
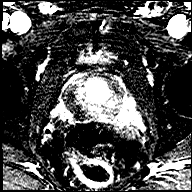
[im 478/695]
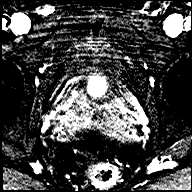
[im 521/695]
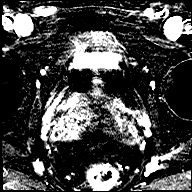
[im 564/695]
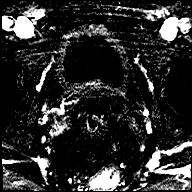
[im 608/695]
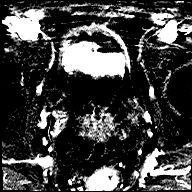
[im 651/695]
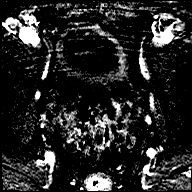
[im 695/695]
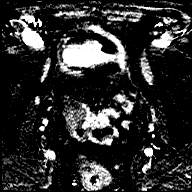

[Series 13: t1_vibe_dixon_tra_f · axial · 2.5mm · 0.91mm/px · z∈[-96,+122]mm · 2 of 88 slices shown]
[im 1/88]
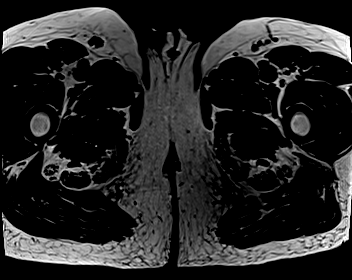
[im 88/88]
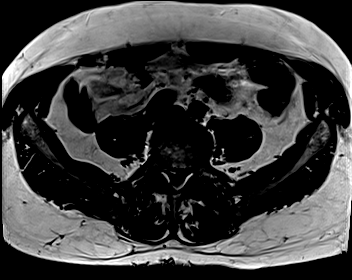

[Series 14: t1_vibe_dixon_tra_w · axial · 2.5mm · 0.91mm/px · z∈[-96,+122]mm · 2 of 88 slices shown]
[im 1/88]
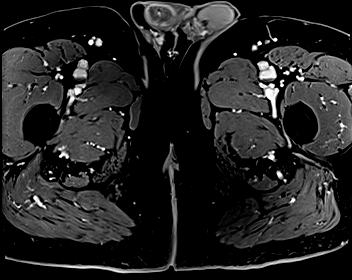
[im 88/88]
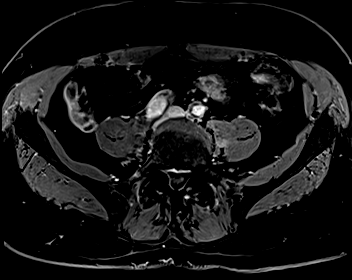

[48 of 48 positions shown; findings below may reference images not displayed]

FINDINGS: Prostate: No dominant central gland nodule.

Within the anterolateral left peripheral zone at the level of the
mid gland, an approximately 4 mm T2 hypointense nodule on coronal
image [DATE] is slightly less well-defined on transverse image [DATE].
Equivocal decreased signal at this level on ADC map [DATE]. No
hyperintensity on long B value diffusion-weighted imaging. Possible
early post-contrast enhancement on 181/11. PI-RADS(v2.1)-3.

Volume: 5.3 x 4.6 by 3.9 cm.  (Volume = 50 cm^3)

Transcapsular spread:  Absent

Seminal vesicle involvement: Absent

Neurovascular bundle involvement: Absent

Pelvic adenopathy: Absent

Bone metastasis: Absent

Other findings: No significant free fluid. Extensive colonic
diverticulosis. Normal urinary bladder.
IMPRESSION: Subtle diminutive focus of T2 hypointensity within the anterolateral
left peripheral zone, mid gland. This may correspond to decreased
signal on ADC map and early post-contrast enhancement. Considered
PI-RADS(v2.1)-3.

## 2021-12-16 ENCOUNTER — Encounter: Payer: Self-pay | Admitting: Orthopedic Surgery

## 2021-12-16 ENCOUNTER — Ambulatory Visit: Payer: Medicare Other | Admitting: Orthopedic Surgery

## 2021-12-16 ENCOUNTER — Ambulatory Visit (INDEPENDENT_AMBULATORY_CARE_PROVIDER_SITE_OTHER): Payer: Medicare Other

## 2021-12-16 VITALS — BP 133/82 | HR 83 | Ht 76.0 in | Wt 228.2 lb

## 2021-12-16 DIAGNOSIS — M25512 Pain in left shoulder: Secondary | ICD-10-CM

## 2021-12-16 DIAGNOSIS — G8929 Other chronic pain: Secondary | ICD-10-CM | POA: Diagnosis not present

## 2021-12-16 DIAGNOSIS — M25511 Pain in right shoulder: Secondary | ICD-10-CM

## 2021-12-16 MED ORDER — METHYLPREDNISOLONE ACETATE 40 MG/ML IJ SUSP
40.0000 mg | Freq: Once | INTRAMUSCULAR | Status: AC
  Administered 2021-12-16: 40 mg via INTRA_ARTICULAR

## 2021-12-16 NOTE — Progress Notes (Signed)
Chief Complaint  Patient presents with   Shoulder Pain    Bilateral//Left hurts worse than the right one. At times trying to put arms over head you can hear popping.   Mitchell Ross scheduled the appointment to get 2 injections in his shoulder 1 on the right one on the left  However we have not seen him since 2018 so now he is a new patient have he will require reexamination  "I was taking of the lug nuts of an old cadillac and aggravated these shoulders"  ROS: no chest pain or SOB  Past Medical History:  Diagnosis Date   Arthritis    Concussion with brief LOC 05/27/12   Hepatitis C    2016 VL UNDETECTABLE   High cholesterol    History of prostate biopsy    states it was negative   Hypertension    Neck injury 05/27/12   BP 133/82   Pulse 83   Ht '6\' 4"'$  (1.93 m)   Wt 228 lb 3 oz (103.5 kg)   BMI 27.78 kg/m   Physical Exam Vitals and nursing note reviewed.  Constitutional:      Appearance: Normal appearance. He is normal weight.  HENT:     Head: Normocephalic and atraumatic.  Eyes:     General: No scleral icterus.    Extraocular Movements: Extraocular movements intact.     Conjunctiva/sclera: Conjunctivae normal.     Pupils: Pupils are equal, round, and reactive to light.  Cardiovascular:     Rate and Rhythm: Normal rate and regular rhythm.  Musculoskeletal:     Cervical back: Normal range of motion and neck supple. No rigidity or tenderness.     Comments: Right and left Shoulder  No tenderenss but painful ROM   Mild weakness   No instability     Lymphadenopathy:     Cervical: No cervical adenopathy.  Skin:    General: Skin is warm and dry.     Capillary Refill: Capillary refill takes less than 2 seconds.     Findings: No bruising, erythema, lesion or rash.  Neurological:     Mental Status: He is alert.     Sensory: No sensory deficit.     Motor: No weakness.     Coordination: Coordination normal.     Gait: Gait normal.     Deep Tendon Reflexes: Reflexes  normal.  Psychiatric:        Mood and Affect: Mood normal.        Behavior: Behavior normal.        Thought Content: Thought content normal.        Judgment: Judgment normal.      New imaging:  Xrays both shoulders   Both show a moderate amount of proximal migration of the humeral heads  But no OA   Encounter Diagnosis  Name Primary?   Chronic pain of both shoulders Yes   Non surgical treatment is the answer right now   Procedure injection right shoulder subacromial joint and left shoulder subacromial joint   procedure note the subacromial injection shoulder RIGHT  Verbal consent was obtained to inject the  RIGHT   Shoulder  Timeout was completed to confirm the injection site is a subacromial space of the  RIGHT  shoulder   Medication used Depo-Medrol 40 mg and lidocaine 1% 3 cc  Anesthesia was provided by ethyl chloride  The injection was performed in the RIGHT  posterior subacromial space. After pinning the skin with alcohol and anesthetized  the skin with ethyl chloride the subacromial space was injected using a 20-gauge needle. There were no complications  Sterile dressing was applied.    Procedure note the subacromial injection shoulder left   Verbal consent was obtained to inject the  Left   Shoulder  Timeout was completed to confirm the injection site is a subacromial space of the  left  shoulder  Medication used Depo-Medrol 40 mg and lidocaine 1% 3 cc  Anesthesia was provided by ethyl chloride  The injection was performed in the left  posterior subacromial space. After pinning the skin with alcohol and anesthetized the skin with ethyl chloride the subacromial space was injected using a 20-gauge needle. There were no complications  Sterile dressing was applied.

## 2021-12-16 NOTE — Patient Instructions (Signed)
Smoking Tobacco Information, Adult Smoking tobacco can be harmful to your health. Tobacco contains a toxic colorless chemical called nicotine. Nicotine causes changes in your brain that make you want more and more. This is called addiction. This can make it hard to stop smoking once you start. Tobacco also has other toxic chemicals that can hurt your body and raise your risk of many cancers. Menthol or "lite" tobacco or cigarette brands are not safer than regular brands. How can smoking tobacco affect me? Smoking tobacco puts you at risk for: Cancer. Smoking is most commonly associated with lung cancer, but can also lead to cancer in other parts of the body. Chronic obstructive pulmonary disease (COPD). This is a long-term lung condition that makes it hard to breathe. It also gets worse over time. High blood pressure (hypertension), heart disease, stroke, heart attack, and lung infections, such as pneumonia. Cataracts. This is when the lenses in the eyes become clouded. Digestive problems. This may include peptic ulcers, heartburn, and gastroesophageal reflux disease (GERD). Oral health problems, such as gum disease, mouth sores, and tooth loss. Loss of taste and smell. Smoking also affects how you look and smell. Smoking may cause: Wrinkles. Yellow or stained teeth, fingers, and fingernails. Bad breath. Bad-smelling clothes and hair. Smoking tobacco can also affect your social life, because: It may be challenging to find places to smoke when away from home. Many workplaces, restaurants, hotels, and public places are tobacco-free. Smoking is expensive. This is due to the cost of tobacco and the long-term costs of treating health problems from smoking. Secondhand smoke may affect those around you. Secondhand smoke can cause lung cancer, breathing problems, and heart disease. Children of smokers have a higher risk for: Sudden infant death syndrome (SIDS). Ear infections. Lung infections. What  actions can I take to prevent health problems? Quit smoking  Do not start smoking. Quit if you already smoke. Do not replace cigarette smoking with vaping devices, such as e-cigarettes. Make a plan to quit smoking and commit to it. Look for programs to help you, and ask your health care provider for recommendations and ideas. Set a date and write down all the reasons you want to quit. Let your friends and family know you are quitting so they can help and support you. Consider finding friends who also want to quit. It can be easier to quit with someone else, so that you can support each other. Talk with your health care provider about using nicotine replacement medicines to help you quit. These include gum, lozenges, patches, sprays, or pills. If you try to quit but return to smoking, stay positive. It is common to slip up when you first quit, so take it one day at a time. Be prepared for cravings. When you feel the urge to smoke, chew gum or suck on hard candy. Lifestyle Stay busy. Take care of your body. Get plenty of exercise, eat a healthy diet, and drink plenty of water. Find ways to manage your stress, such as meditation, yoga, exercise, or time spent with friends and family. Ask your health care provider about having regular tests (screenings) to check for cancer. This may include blood tests, imaging tests, and other tests. Where to find support To get support to quit smoking, consider: Asking your health care provider for more information and resources. Joining a support group for people who want to quit smoking in your local community. There are many effective programs that may help you to quit. Calling the smokefree.gov counselor   helpline at 1-800-QUIT-NOW (1-800-784-8669). Where to find more information You may find more information about quitting smoking from: Centers for Disease Control and Prevention: cdc.gov/tobacco Smokefree.gov: smokefree.gov American Lung Association:  freedomfromsmoking.org Contact a health care provider if: You have problems breathing. Your lips, nose, or fingers turn blue. You have chest pain. You are coughing up blood. You feel like you will faint. You have other health changes that cause you to worry. Summary Smoking tobacco can negatively affect your health, the health of those around you, your finances, and your social life. Do not start smoking. Quit if you already smoke. If you need help quitting, ask your health care provider. Consider joining a support group for people in your local community who want to quit smoking. There are many effective programs that may help you to quit. This information is not intended to replace advice given to you by your health care provider. Make sure you discuss any questions you have with your health care provider. Document Revised: 02/26/2021 Document Reviewed: 02/26/2021 Elsevier Patient Education  2023 [employer]  

## 2022-05-15 ENCOUNTER — Encounter: Payer: Self-pay | Admitting: Radiology

## 2022-07-24 ENCOUNTER — Other Ambulatory Visit: Payer: Self-pay | Admitting: Urology

## 2022-07-24 DIAGNOSIS — R972 Elevated prostate specific antigen [PSA]: Secondary | ICD-10-CM

## 2022-09-05 ENCOUNTER — Encounter: Payer: Self-pay | Admitting: Urology

## 2022-09-08 ENCOUNTER — Ambulatory Visit
Admission: RE | Admit: 2022-09-08 | Discharge: 2022-09-08 | Disposition: A | Discharge status: Home / Self Care | Payer: Medicare Other | Source: Ambulatory Visit | Attending: Urology | Admitting: Urology

## 2022-09-08 DIAGNOSIS — R972 Elevated prostate specific antigen [PSA]: Secondary | ICD-10-CM

## 2022-09-08 MED ORDER — GADOPICLENOL 0.5 MMOL/ML IV SOLN
10.0000 mL | Freq: Once | INTRAVENOUS | Status: AC | PRN
  Administered 2022-09-08: 10 mL via INTRAVENOUS

## 2022-10-16 DIAGNOSIS — C61 Malignant neoplasm of prostate: Secondary | ICD-10-CM

## 2022-10-16 HISTORY — DX: Malignant neoplasm of prostate: C61

## 2022-11-19 ENCOUNTER — Other Ambulatory Visit (HOSPITAL_COMMUNITY): Payer: Self-pay | Admitting: Urology

## 2022-11-19 DIAGNOSIS — C61 Malignant neoplasm of prostate: Secondary | ICD-10-CM

## 2022-12-01 ENCOUNTER — Encounter (HOSPITAL_COMMUNITY)
Admission: RE | Admit: 2022-12-01 | Discharge: 2022-12-01 | Disposition: A | Discharge status: Home / Self Care | Payer: Medicare Other | Source: Ambulatory Visit | Attending: Urology | Admitting: Urology

## 2022-12-01 DIAGNOSIS — C61 Malignant neoplasm of prostate: Secondary | ICD-10-CM | POA: Diagnosis present

## 2022-12-01 MED ORDER — FLOTUFOLASTAT F 18 GALLIUM 296-5846 MBQ/ML IV SOLN
8.0000 | Freq: Once | INTRAVENOUS | Status: AC
  Administered 2022-12-01: 8.02 via INTRAVENOUS
  Filled 2022-12-01: qty 8

## 2022-12-25 ENCOUNTER — Telehealth: Payer: Self-pay | Admitting: Radiation Oncology

## 2022-12-25 NOTE — Telephone Encounter (Signed)
LVM to schedule CON with Dr. Kathrynn Running on home and mobile numbers.

## 2022-12-29 ENCOUNTER — Encounter: Payer: Self-pay | Admitting: Urology

## 2022-12-29 DIAGNOSIS — C61 Malignant neoplasm of prostate: Secondary | ICD-10-CM | POA: Insufficient documentation

## 2022-12-29 NOTE — Progress Notes (Signed)
GU Location of Tumor / Histology: Prostate Ca  If Prostate Cancer, Gleason Score is (4 + 3) and PSA is (6.9 on 07/2022)  Biopsies      12/01/2022 Dr. Heloise Purpura NM PET (PSMA) Skull to Mid Thigh CLINICAL DATA:  67 year old male with elevated PSA 6.9. Negative biopsy through prior.  IMPRESSION: 1. Focal radiotracer activity in the posterior RIGHT aspect of the prostate gland towards the base consistent with primary prostate adenocarcinoma. 2. No evidence of metastatic adenopathy in the pelvis or periaortic retroperitoneum. 3. No evidence of visceral metastasis or skeletal metastasis.    09/08/2022 Dr. Heloise Purpura MR Prostate with/without Contrast CLINICAL DATA: Elevated PSA.   IMPRESSION: Lesion 1: 1.5 cm peripheral zone lesion in the left posteromedial base, with signs of extracapsular extension and involvement of the left seminal vesicle, highly suspicious for high-grade carcinoma. PI-RADS 5 (v2.1): Very high (clinically significant cancer highly likely).   Lesion 2: 5 mm peripheral zone nodule in the left anterolateral mid gland, suspicious for high-grade carcinoma. PI-RADS 4 (v2.1): High (clinically significant cancer likely).   Lesion 3: 8 mm peripheral zone nodule in the right anterior mid gland, equivocal for high-grade carcinoma. PI-RADS 3 (v2.1): Intermediate (clinically significant cancer equivocal).   No evidence of pelvic lymphadenopathy or osseous metastatic disease.  Past/Anticipated interventions by urology, if any: NA  Past/Anticipated interventions by medical oncology, if any: NA  Weight changes, if any: {:18581}  IPSS: SHIM:  Bowel/Bladder complaints, if any: {:18581}   Nausea/Vomiting, if any: {:18581}  Pain issues, if any:  {:18581}  SAFETY ISSUES: Prior radiation? {:18581} Pacemaker/ICD? {:18581} Possible current pregnancy? Male Is the patient on methotrexate? No  Current Complaints / other details:

## 2022-12-30 ENCOUNTER — Ambulatory Visit
Admission: RE | Admit: 2022-12-30 | Discharge: 2022-12-30 | Disposition: A | Discharge status: Home / Self Care | Payer: Medicare Other | Source: Ambulatory Visit | Attending: Radiation Oncology | Admitting: Radiation Oncology

## 2022-12-30 ENCOUNTER — Encounter: Payer: Self-pay | Admitting: Radiation Oncology

## 2022-12-30 VITALS — BP 143/96 | HR 81 | Temp 97.1°F | Resp 18 | Ht 76.0 in | Wt 233.0 lb

## 2022-12-30 DIAGNOSIS — I10 Essential (primary) hypertension: Secondary | ICD-10-CM | POA: Diagnosis not present

## 2022-12-30 DIAGNOSIS — Z7984 Long term (current) use of oral hypoglycemic drugs: Secondary | ICD-10-CM | POA: Insufficient documentation

## 2022-12-30 DIAGNOSIS — I700 Atherosclerosis of aorta: Secondary | ICD-10-CM | POA: Diagnosis not present

## 2022-12-30 DIAGNOSIS — C61 Malignant neoplasm of prostate: Secondary | ICD-10-CM

## 2022-12-30 DIAGNOSIS — Z79899 Other long term (current) drug therapy: Secondary | ICD-10-CM | POA: Insufficient documentation

## 2022-12-30 DIAGNOSIS — E7800 Pure hypercholesterolemia, unspecified: Secondary | ICD-10-CM | POA: Diagnosis not present

## 2022-12-30 DIAGNOSIS — Z791 Long term (current) use of non-steroidal anti-inflammatories (NSAID): Secondary | ICD-10-CM | POA: Diagnosis not present

## 2022-12-30 DIAGNOSIS — F17210 Nicotine dependence, cigarettes, uncomplicated: Secondary | ICD-10-CM | POA: Diagnosis not present

## 2022-12-30 DIAGNOSIS — Z7982 Long term (current) use of aspirin: Secondary | ICD-10-CM | POA: Diagnosis not present

## 2022-12-30 NOTE — Progress Notes (Signed)
Introduced myself to the patient as the prostate nurse navigator.  No barriers to care identified at this time.  He is here to discuss his radiation treatment options.  I gave him my business card and asked him to call me with questions or concerns.  Verbalized understanding.  ?

## 2022-12-30 NOTE — Progress Notes (Signed)
Radiation Oncology         [phone] ________________________________  Initial Outpatient Consultation  Name: Mitchell Ross MRN: [id number]  Date: 12/30/2022  DOB: [birth year]  CC:Elfredia Nevins, MD  Heloise Purpura, MD   REFERRING PHYSICIAN: Heloise Purpura, MD  DIAGNOSIS: 67 y.o. gentleman with locally advanced, Stage T3b adenocarcinoma of the prostate involving the left seminal vesicle with Gleason score of 4+3, and PSA of 6.9.    ICD-10-CM   1. Malignant neoplasm of prostate (HCC)  C61       HISTORY OF PRESENT ILLNESS: Mitchell Ross is a 67 y.o. male with a diagnosis of prostate cancer. He has been followed by Dr Laverle Patter at [facility] since 2012 for an elevated, fluctuating PSA and a strong family history of prostate cancer. His PSA has fluctuated between as low as 1.6 and as high as 9. He previously underwent prostate biopsies in 01/2011 and 03/2020, both were negative for malignancy. More recently, he was found to have a persistently rising PSA-- 4.21 in 04/2021, 5.51 in 11/2021, 4.49 in 01/2022, and 6.9 in 06/2022. This prompted a repeat prostate MRI on 09/08/22 showing a 1.5 cm peripheral zone lesion in left posteromedial base, with signs of extracapsular extension and involvement of the left seminal vesicle (PI-RADS 5, lesion #1), a 5 mm peripheral zone nodule in the  left anterolateral mid gland (PI-RADS 4, lesion #2) and an 8 mm peripheral zone nodule in the right anterior mid gland (PI-RADS 3, lesion #3); no evidence of pelvic lymphadenopathy or osseous metastatic disease. The patient proceeded to MRI fusion biopsy of the prostate on 11/10/22.  The prostate volume measured 44.7 cc.  Out of 24 core biopsies, 4 were positive.  The maximum Gleason score was 4+3, and this was seen in all four samples from the MRI ROI #1 (with perineural invasion).  He underwent staging PSMA PET scan on 12/01/22 which confirmed locally advanced disease involving the left seminal vesicle but no  evidence of disease outside of the prostate.  The patient reviewed the biopsy and imaging results with his urologist and he has kindly been referred today for discussion of potential radiation treatment options.   PREVIOUS RADIATION THERAPY: No  PAST MEDICAL HISTORY:  Past Medical History:  Diagnosis Date   Arthritis    Concussion with brief LOC 05/27/12   Hepatitis C    2016 VL UNDETECTABLE   High cholesterol    History of prostate biopsy    states it was negative   Hypertension    Neck injury 05/27/12      PAST SURGICAL HISTORY: Past Surgical History:  Procedure Laterality Date   ANTERIOR CERVICAL DECOMP/DISCECTOMY FUSION N/A 01/20/2013   Procedure: ANTERIOR CERVICAL DECOMPRESSION/DISCECTOMY FUSION 1 LEVEL;  Surgeon: Maeola Harman, MD;  Location: [facility];  Service: Neurosurgery;  Laterality: N/A;  C3-4 Anterior cervical decompression/diskectomy/fusion   BIOPSY  03/03/2016   Procedure: BIOPSY;  Surgeon: West Bali, MD;  Location: [facility];  Service: Endoscopy;;  polypectomy by biopsy at ascending colon   COLONOSCOPY     COLONOSCOPY N/A 03/03/2016   Procedure: COLONOSCOPY;  Surgeon: West Bali, MD;  Location: [facility];  Service: Endoscopy;  Laterality: N/A;  8:30 Am   COLONOSCOPY WITH PROPOFOL N/A 06/21/2019   Procedure: COLONOSCOPY WITH PROPOFOL;  Surgeon: West Bali, MD;  Location: [facility];  Service: Endoscopy;  Laterality: N/A;  12:00pm   FINGER SURGERY     tip of finger amputation  FLEXIBLE SIGMOIDOSCOPY N/A 12/11/2016   Procedure: FLEXIBLE SIGMOIDOSCOPY;  Surgeon: West Bali, MD;  Location: [facility];  Service: Endoscopy;  Laterality: N/A;  730    HEMORRHOID BANDING N/A 12/11/2016   Procedure: HEMORRHOID BANDING;  Surgeon: West Bali, MD;  Location: [facility];  Service: Endoscopy;  Laterality: N/A;   POLYPECTOMY  03/03/2016   Procedure: POLYPECTOMY;  Surgeon: West Bali, MD;  Location: [facility];  Service:  Endoscopy;;  transverse x3; rectal and sigmoid polyps;   POLYPECTOMY  06/21/2019   Procedure: POLYPECTOMY;  Surgeon: West Bali, MD;  Location: [facility];  Service: Endoscopy;;   PROSTATE BIOPSY     torn right knee cartiledge  11/15/1968    FAMILY HISTORY:  Family History  Problem Relation Age of Onset   Stomach cancer Mother    Prostate cancer Father    Cancer Other    Arthritis Other    Colon cancer Neg Hx     SOCIAL HISTORY:  Social History   Socioeconomic History   Marital status: Divorced    Spouse name: Not on file   Number of children: Not on file   Years of education: Not on file   Highest education level: Not on file  Occupational History   Not on file  Tobacco Use   Smoking status: Every Day    Current packs/day: 0.20    Average packs/day: 0.2 packs/day for 30.0 years (6.0 ttl pk-yrs)    Types: Cigarettes   Smokeless tobacco: Never   Tobacco comments:    1/2 pack a day  Vaping Use   Vaping status: Never Used  Substance and Sexual Activity   Alcohol use: Yes    Alcohol/week: 0.0 standard drinks of alcohol    Comment: 6 pack beer/week   Drug use: No   Sexual activity: Not Currently  Other Topics Concern   Not on file  Social History Narrative   Not on file   Social Determinants of Health   Financial Resource Strain: Not on file  Food Insecurity: No Food Insecurity (12/30/2022)   Hunger Vital Sign    Worried About Running Out of Food in the Last Year: Never true    Ran Out of Food in the Last Year: Never true  Transportation Needs: No Transportation Needs (12/30/2022)   PRAPARE - Administrator, Civil Service (Medical): No    Lack of Transportation (Non-Medical): No  Physical Activity: Not on file  Stress: Not on file  Social Connections: Not on file  Intimate Partner Violence: Not At Risk (12/30/2022)   Humiliation, Afraid, Rape, and Kick questionnaire    Fear of Current or Ex-Partner: No    Emotionally Abused: No     Physically Abused: No    Sexually Abused: No    ALLERGIES: Patient has no known allergies.  MEDICATIONS:  Current Outpatient Medications  Medication Sig Dispense Refill   amLODipine (NORVASC) 10 MG tablet Take 10 mg by mouth daily.     aspirin 325 MG tablet Take 325 mg by mouth daily.      cholecalciferol (VITAMIN D3) 25 MCG (1000 UNIT) tablet Take 1,000 Units by mouth daily.     Coenzyme Q10 (CO Q-10) 100 MG CAPS Take 100 mg by mouth daily.     meloxicam (MOBIC) 7.5 MG tablet Take 7.5 mg by mouth 2 (two) times daily.     metFORMIN (GLUCOPHAGE) 500 MG tablet Take 500 mg by mouth 2 (two) times daily.  Multiple Vitamins-Minerals (CENTRUM SILVER PO) Take 1 tablet by mouth daily.     oxyCODONE-acetaminophen (PERCOCET) 10-325 MG tablet Take 1 tablet by mouth every 4 (four) hours.     rosuvastatin (CRESTOR) 10 MG tablet Take 10 mg by mouth at bedtime.     Saw Palmetto 450 MG CAPS Take 900 mg by mouth daily.     selenium 200 MCG TABS tablet Take 200 mcg by mouth daily.      Turmeric 500 MG TABS Take 500 mg by mouth daily.      zinc gluconate 50 MG tablet Take 50 mg by mouth daily.     Lycopene 10 MG CAPS Take 10 mg by mouth daily. (Patient not taking: Reported on 12/30/2022)     Current Facility-Administered Medications  Medication Dose Route Frequency Provider Last Rate Last Admin   triamcinolone acetonide (KENALOG) 10 MG/ML injection 10 mg  10 mg Other Once Lenn Sink, DPM        REVIEW OF SYSTEMS:  On review of systems, the patient reports that he is doing well overall. He denies any chest pain, shortness of breath, cough, fevers, chills, night sweats, unintended weight changes. He denies any bowel disturbances, and denies abdominal pain, nausea or vomiting. He denies any new musculoskeletal or joint aches or pains. His IPSS was 13, indicating moderate urinary symptoms. His SHIM was 12, indicating he has moderatesevere} erectile dysfunction. A complete review of systems is obtained  and is otherwise negative.    PHYSICAL EXAM:  Wt Readings from Last 3 Encounters:  12/30/22 233 lb (105.7 kg)  12/16/21 228 lb 3 oz (103.5 kg)  06/21/19 225 lb (102.1 kg)   Temp Readings from Last 3 Encounters:  12/30/22 (!) 97.1 F (36.2 C) (Temporal)  06/21/19 98.3 F (36.8 C) (Oral)  06/20/19 97.7 F (36.5 C) (Oral)   BP Readings from Last 3 Encounters:  12/30/22 (!) 143/96  12/16/21 133/82  06/21/19 125/83   Pulse Readings from Last 3 Encounters:  12/30/22 81  12/16/21 83  06/21/19 68   Pain Assessment Pain Score: 0-No pain/10  In general this is a well appearing African American male in no acute distress. He's alert and oriented x4 and appropriate throughout the examination. Cardiopulmonary assessment is negative for acute distress, and he exhibits normal effort.     KPS = 100  100 - Normal; no complaints; no evidence of disease. 90   - Able to carry on normal activity; minor signs or symptoms of disease. 80   - Normal activity with effort; some signs or symptoms of disease. 42   - Cares for self; unable to carry on normal activity or to do active work. 60   - Requires occasional assistance, but is able to care for most of his personal needs. 50   - Requires considerable assistance and frequent medical care. 40   - Disabled; requires special care and assistance. 30   - Severely disabled; hospital admission is indicated although death not imminent. 20   - Very sick; hospital admission necessary; active supportive treatment necessary. 10   - Moribund; fatal processes progressing rapidly. 0     - Dead  Karnofsky DA, Abelmann WH, Craver LS and Burchenal [facility] [phone]) The use of the nitrogen mustards in the palliative treatment of carcinoma: with particular reference to bronchogenic carcinoma Cancer 1 [id number]  LABORATORY DATA:  Lab Results  Component Value Date   WBC 4.5 07/10/2014   HGB 14.7 07/10/2014   HCT 44.5 07/10/2014  MCV 85.2 07/10/2014   PLT 278  07/10/2014   Lab Results  Component Value Date   NA 139 06/20/2019   K 4.0 06/20/2019   CL 108 06/20/2019   CO2 23 06/20/2019   Lab Results  Component Value Date   ALT 25 06/20/2019   AST 23 06/20/2019   ALKPHOS 64 06/20/2019   BILITOT 0.6 06/20/2019     RADIOGRAPHY: NM PET (PSMA) SKULL TO MID THIGH  Result Date: 12/12/2022 CLINICAL DATA:  67 year old male with elevated PSA 6.9. Negative biopsy through prior. EXAM: NUCLEAR MEDICINE PET SKULL BASE TO THIGH TECHNIQUE: 8.0 mCi Flotufolastat (Posluma) was injected intravenously. Full-ring PET imaging was performed from the skull base to thigh after the radiotracer. CT data was obtained and used for attenuation correction and anatomic localization. COMPARISON:  Prostate MRI 09/08/2022 FINDINGS: NECK No radiotracer activity in neck lymph nodes. Incidental CT finding: None. CHEST No radiotracer accumulation within mediastinal or hilar lymph nodes. No suspicious pulmonary nodules on the CT scan. Incidental CT finding: Coronary artery calcification and aortic atherosclerotic calcification. ABDOMEN/PELVIS Prostate: Focal intense radiotracer activity in the posterior RIGHT aspect of the prostate gland towards the base with SUV max equal 13.8 (image 195). This corresponds to suspicious lesion on comparison MRI. Linear activity centrally within the gland (image 194 through 198) is favored urine within the prostatic urethra. Lymph nodes: No abnormal radiotracer accumulation within pelvic or abdominal nodes. Liver: No evidence of liver metastasis. Incidental CT finding: Benign cyst of the LEFT kidney. Atherosclerotic calcification of the aorta. Multiple diverticula of the descending colon and sigmoid colon without acute inflammation. SKELETON No focal activity to suggest skeletal metastasis. IMPRESSION: 1. Focal radiotracer activity in the posterior RIGHT aspect of the prostate gland towards the base consistent with primary prostate adenocarcinoma. 2. No  evidence of metastatic adenopathy in the pelvis or periaortic retroperitoneum. 3. No evidence of visceral metastasis or skeletal metastasis. Electronically Signed   By: Genevive Bi M.D.   On: 12/12/2022 16:43      IMPRESSION/PLAN: 1. 67 y.o. gentleman with locally advanced, Stage T3b adenocarcinoma of the prostate involving the left seminal vesicle with Gleason score of 4+3, and PSA of 6.9.  We discussed the patient's workup and outlined the nature of prostate cancer in this setting. The patient's T stage, Gleason's score, and PSA put him into the unfavorable intermediate risk group but his imaging studies confirm locally advanced disease involving the left seminal vesicle. Accordingly, he is eligible for a variety of potential treatment options including prostatectomy or LT-ADT concurrent with either 8 weeks of external radiation or 5 weeks of external radiation with an upfront brachytherapy boos. We discussed the available radiation techniques, and focused on the details and logistics of delivery. We discussed and outlined the risks, benefits, short and long-term effects associated with radiotherapy and compared and contrasted these with prostatectomy. We discussed the role of SpaceOAR gel in reducing the rectal toxicity associated with radiotherapy. We also detailed the role of ADT in the treatment of locally advanced/high risk prostate cancer and outlined the associated side effects that could be expected with this therapy. He was encouraged to ask questions that were answered to his stated satisfaction.  At the conclusion of our conversation, the patient is interested in moving forward with brachytherapy boost and use of SpaceOAR gel followed by a 5 week course of daily radiotherapy concurrent with LT-ADT. We will share our discussion with Dr. Laverle Patter and coordinate for a follow up visit in the urology office, first  available, to start ADT now. We will also coordinate for the seed boost procedure  approximately 2 months from the start of ADT.  The patient met briefly with Darryl Nestle in our office who will be working closely with him to coordinate OR scheduling and pre and post procedure appointments.  We will contact the pharmaceutical rep to ensure that SpaceOAR is available at the time of procedure.  We will see him back 2-3 weeks after his seed boost procedure and will proceed with [facility] prostate for treatment planning in anticipation of beginning the 5 week course of daily external beam radiation 3-4 weeks after the seed boost procedure. He appears to have a good understanding of his disease and our treatment recommendations which are of curative intent and he is in agreement with the stated plan. We enjoyed meeting him today and look forward to continuing to participate in his care.   We personally spent 70 minutes in this encounter including chart review, reviewing radiological studies, meeting face-to-face with the patient, entering orders and completing documentation.    Marguarite Arbour, PA-C    Margaretmary Dys, MD  [facility]  Radiation Oncology Direct Dial: [phone]  Fax: [phone] [email or web address]  Skype  [email or web address]   This document serves as a record of services personally performed by Margaretmary Dys, MD and Marcello Fennel, PA-C. It was created on their behalf by Mickie Bail, a trained medical scribe. The creation of this record is based on the scribe's personal observations and the provider's statements to them. This document has been checked and approved by the attending provider.

## 2022-12-31 ENCOUNTER — Telehealth: Payer: Self-pay | Admitting: *Deleted

## 2022-12-31 NOTE — Telephone Encounter (Signed)
CALLED PATIENT TO INFORM OF APPT. FOR ADT ON 01-21-23- ARRIVAL TIME- 3:45 PM @ DR. BORDEN'S OFFICE, LVM FOR A RETURN CALL

## 2023-01-07 ENCOUNTER — Other Ambulatory Visit: Payer: Self-pay | Admitting: Urology

## 2023-01-08 ENCOUNTER — Telehealth: Payer: Self-pay | Admitting: *Deleted

## 2023-01-08 NOTE — Telephone Encounter (Signed)
Called patient to inform of pre-seed appts. and implant date, lvm for a return call

## 2023-01-09 ENCOUNTER — Telehealth: Payer: Self-pay | Admitting: *Deleted

## 2023-01-09 NOTE — Telephone Encounter (Signed)
Returned patient's phone call, spoke with patient 

## 2023-01-27 NOTE — Progress Notes (Signed)
Patient received Eligard 45mg  on 11/8.  RN left message for patient to call back to review next steps.   Plan of care in progress.

## 2023-03-13 NOTE — Progress Notes (Signed)
RN spoke with patient to review any questions or barriers prior to upcoming brachy boost on 04/13/23.  RN provided education on next steps, all questions answered.  No additional needs at this time.

## 2023-03-17 ENCOUNTER — Telehealth: Payer: Self-pay | Admitting: *Deleted

## 2023-03-17 NOTE — Telephone Encounter (Signed)
Called patient to remind of pre-seed appts. for 03-19-23- arrival time-10:15 am @ [facility], lvm for a return call

## 2023-03-18 NOTE — Progress Notes (Signed)
  Radiation Oncology         (336) [phone] ________________________________  Name: Mitchell Ross MRN: [id number]  Date: 03/19/2023  DOB: [birth year]  SIMULATION AND TREATMENT PLANNING NOTE PUBIC ARCH STUDY  RR:Qldrn, Jerilynn, MD  Renda Glance, MD  DIAGNOSIS: 68 y.o. gentleman with locally advanced, Stage T3b adenocarcinoma of the prostate involving the left seminal vesicle with Gleason score of 4+3, and PSA of 6.9.   Oncology History  Malignant neoplasm of prostate (HCC)  11/10/2022 Cancer Staging   Staging form: Prostate, AJCC 8th Edition - Clinical stage from 11/10/2022: Stage IIIB (cT3b, cN0, cM0, PSA: 6.9, Grade Group: 3) - Signed by Sherwood Rise, PA-C on 12/29/2022 Histopathologic type: Adenocarcinoma, NOS Stage prefix: Initial diagnosis Prostate specific antigen (PSA) range: Less than 10 Gleason primary pattern: 4 Gleason secondary pattern: 3 Gleason score: 7 Histologic grading system: 5 grade system Number of biopsy cores examined: 24 Number of biopsy cores positive: 4 Location of positive needle core biopsies: One side   12/29/2022 Initial Diagnosis   Malignant neoplasm of prostate (HCC)       ICD-10-CM   1. Malignant neoplasm of prostate (HCC)  C61       COMPLEX SIMULATION:  The patient presented today for evaluation for possible prostate seed implant. He was brought to the radiation planning suite and placed supine on the CT couch. A 3-dimensional image study set was obtained in upload to the planning computer. There, on each axial slice, I contoured the prostate gland. Then, using three-dimensional radiation planning tools I reconstructed the prostate in view of the structures from the transperineal needle pathway to assess for possible pubic arch interference. In doing so, I did not appreciate any pubic arch interference. Also, the patient's prostate volume was estimated based on the drawn structure. The volume was 34 cc.  Given the pubic arch appearance and  prostate volume, patient remains a good candidate to proceed with prostate seed implant. Today, he freely provided informed written consent to proceed.    PLAN: The patient will undergo prostate seed implant boost to be followed by a 5 week course of daily IMRT; concurrent with ADT (6 month Eligard started 01/23/23).   ________________________________  [name], M.D.

## 2023-03-18 NOTE — Progress Notes (Signed)
 Radiation Oncology         [phone] ________________________________  Outpatient Follow up- Pre-seed visit  Name: Mitchell Ross MRN: [id number]  Date: 03/19/2023  DOB: 1955/11/19  CC:Bertell Satterfield, MD  Renda Glance, MD   REFERRING PHYSICIAN: Renda Glance, MD  DIAGNOSIS: 68 y.o. gentleman with locally advanced, Stage T3b adenocarcinoma of the prostate involving the left seminal vesicle with Gleason score of 4+3, and PSA of 6.9.     ICD-10-CM   1. Malignant neoplasm of prostate (HCC)  C61       HISTORY OF PRESENT ILLNESS: Mitchell Ross is a 68 y.o. male with a diagnosis of prostate cancer. He has been followed by Dr Renda at [facility] since 2012 for an elevated, fluctuating PSA and a strong family history of prostate cancer. His PSA has fluctuated between as low as 1.6 and as high as 9. He previously underwent prostate biopsies in 01/2011 and 03/2020, both were negative for malignancy. More recently, he was found to have a persistently rising PSA-- 4.21 in 04/2021, 5.51 in 11/2021, 4.49 in 01/2022, and 6.9 in 06/2022. This prompted a repeat prostate MRI on 09/08/22 showing a 1.5 cm peripheral zone lesion in left posteromedial base, with signs of extracapsular extension and involvement of the left seminal vesicle (PI-RADS 5, lesion #1), a 5 mm peripheral zone nodule in the  left anterolateral mid gland (PI-RADS 4, lesion #2) and an 8 mm peripheral zone nodule in the right anterior mid gland (PI-RADS 3, lesion #3); no evidence of pelvic lymphadenopathy or osseous metastatic disease. The patient proceeded to MRI fusion biopsy of the prostate on 11/10/22.  The prostate volume measured 44.7 cc.  Out of 24 core biopsies, 4 were positive.  The maximum Gleason score was 4+3, and this was seen in all four samples from the MRI ROI #1 (with perineural invasion).   He underwent staging PSMA PET scan on 12/01/22 which confirmed locally advanced disease involving the left seminal vesicle but no  evidence of disease outside of the prostate.  The patient reviewed the biopsy results with his urologist and was kindly referred to us  for discussion of potential radiation treatment options. We initially met the patient on 12/30/22 and he was most interested in proceeding with brachytherapy boost and use of SpaceOAR gel followed by a 5 week course of daily radiotherapy concurrent with LT-ADT for treatment of his disease.  He started ADT with a 71-month Eligard injection on 01/23/2023 and  is here today for his pre-procedure imaging for planning and to answer any additional questions he may have about this treatment.   PREVIOUS RADIATION THERAPY: No  PAST MEDICAL HISTORY:  Past Medical History:  Diagnosis Date   Arthritis    Concussion with brief LOC 05/27/12   Hepatitis C    2016 VL UNDETECTABLE   High cholesterol    History of prostate biopsy    states it was negative   Hypertension    Neck injury 05/27/12      PAST SURGICAL HISTORY: Past Surgical History:  Procedure Laterality Date   ANTERIOR CERVICAL DECOMP/DISCECTOMY FUSION N/A 01/20/2013   Procedure: ANTERIOR CERVICAL DECOMPRESSION/DISCECTOMY FUSION 1 LEVEL;  Surgeon: Fairy Levels, MD;  Location: [facility];  Service: Neurosurgery;  Laterality: N/A;  C3-4 Anterior cervical decompression/diskectomy/fusion   BIOPSY  03/03/2016   Procedure: BIOPSY;  Surgeon: Margo LITTIE Haddock, MD;  Location: [facility];  Service: Endoscopy;;  polypectomy by biopsy at ascending colon   COLONOSCOPY  COLONOSCOPY N/A 03/03/2016   Procedure: COLONOSCOPY;  Surgeon: Margo LITTIE Haddock, MD;  Location: [facility];  Service: Endoscopy;  Laterality: N/A;  8:30 Am   COLONOSCOPY WITH PROPOFOL  N/A 06/21/2019   Procedure: COLONOSCOPY WITH PROPOFOL ;  Surgeon: Haddock Margo LITTIE, MD;  Location: [facility];  Service: Endoscopy;  Laterality: N/A;  12:00pm   FINGER SURGERY     tip of finger amputation   FLEXIBLE SIGMOIDOSCOPY N/A 12/11/2016   Procedure: FLEXIBLE  SIGMOIDOSCOPY;  Surgeon: Haddock Margo LITTIE, MD;  Location: [facility];  Service: Endoscopy;  Laterality: N/A;  730    HEMORRHOID BANDING N/A 12/11/2016   Procedure: HEMORRHOID BANDING;  Surgeon: Haddock Margo LITTIE, MD;  Location: [facility];  Service: Endoscopy;  Laterality: N/A;   POLYPECTOMY  03/03/2016   Procedure: POLYPECTOMY;  Surgeon: Margo LITTIE Haddock, MD;  Location: [facility];  Service: Endoscopy;;  transverse x3; rectal and sigmoid polyps;   POLYPECTOMY  06/21/2019   Procedure: POLYPECTOMY;  Surgeon: Haddock Margo LITTIE, MD;  Location: [facility];  Service: Endoscopy;;   PROSTATE BIOPSY     torn right knee cartiledge  11/15/1968    FAMILY HISTORY:  Family History  Problem Relation Age of Onset   Stomach cancer Mother    Prostate cancer Father    Cancer Other    Arthritis Other    Colon cancer Neg Hx     SOCIAL HISTORY:  Social History   Socioeconomic History   Marital status: Divorced    Spouse name: Not on file   Number of children: Not on file   Years of education: Not on file   Highest education level: Not on file  Occupational History   Not on file  Tobacco Use   Smoking status: Every Day    Current packs/day: 0.20    Average packs/day: 0.2 packs/day for 30.0 years (6.0 ttl pk-yrs)    Types: Cigarettes   Smokeless tobacco: Never   Tobacco comments:    1/2 pack a day  Vaping Use   Vaping status: Never Used  Substance and Sexual Activity   Alcohol use: Yes    Alcohol/week: 0.0 standard drinks of alcohol    Comment: 6 pack beer/week   Drug use: No   Sexual activity: Not Currently  Other Topics Concern   Not on file  Social History Narrative   Not on file   Social Drivers of Health   Financial Resource Strain: Not on file  Food Insecurity: No Food Insecurity (12/30/2022)   Hunger Vital Sign    Worried About Running Out of Food in the Last Year: Never true    Ran Out of Food in the Last Year: Never true  Transportation Needs: No Transportation  Needs (12/30/2022)   PRAPARE - Administrator, Civil Service (Medical): No    Lack of Transportation (Non-Medical): No  Physical Activity: Not on file  Stress: Not on file  Social Connections: Not on file  Intimate Partner Violence: Not At Risk (12/30/2022)   Humiliation, Afraid, Rape, and Kick questionnaire    Fear of Current or Ex-Partner: No    Emotionally Abused: No    Physically Abused: No    Sexually Abused: No    ALLERGIES: Patient has no known allergies.  MEDICATIONS:  Current Outpatient Medications  Medication Sig Dispense Refill   amLODipine (NORVASC) 10 MG tablet Take 10 mg by mouth daily.     aspirin 325 MG tablet Take 325 mg by mouth  daily.      cholecalciferol (VITAMIN D3) 25 MCG (1000 UNIT) tablet Take 1,000 Units by mouth daily.     Coenzyme Q10 (CO Q-10) 100 MG CAPS Take 100 mg by mouth daily.     Lycopene 10 MG CAPS Take 10 mg by mouth daily. (Patient not taking: Reported on 12/30/2022)     meloxicam (MOBIC) 7.5 MG tablet Take 7.5 mg by mouth 2 (two) times daily.     metFORMIN (GLUCOPHAGE) 500 MG tablet Take 500 mg by mouth 2 (two) times daily.     Multiple Vitamins-Minerals (CENTRUM SILVER PO) Take 1 tablet by mouth daily.     oxyCODONE -acetaminophen  (PERCOCET) 10-325 MG tablet Take 1 tablet by mouth every 4 (four) hours.     rosuvastatin (CRESTOR) 10 MG tablet Take 10 mg by mouth at bedtime.     Saw Palmetto 450 MG CAPS Take 900 mg by mouth daily.     selenium  200 MCG TABS tablet Take 200 mcg by mouth daily.      Turmeric 500 MG TABS Take 500 mg by mouth daily.      zinc  gluconate 50 MG tablet Take 50 mg by mouth daily.     Current Facility-Administered Medications  Medication Dose Route Frequency Provider Last Rate Last Admin   triamcinolone  acetonide (KENALOG ) 10 MG/ML injection 10 mg  10 mg Other Once Magdalen Pasco RAMAN, DPM        REVIEW OF SYSTEMS:  On review of systems, the patient reports that he is doing well overall. He denies any chest  pain, shortness of breath, cough, fevers, chills, night sweats, unintended weight changes. He denies any bowel disturbances, and denies abdominal pain, nausea or vomiting. He denies any new musculoskeletal or joint aches or pains. His IPSS was 13, indicating moderate urinary symptoms. His SHIM was 12, indicating he has moderatesevere} erectile dysfunction. A complete review of systems is obtained and is otherwise negative.     PHYSICAL EXAM:  Wt Readings from Last 3 Encounters:  12/30/22 233 lb (105.7 kg)  12/16/21 228 lb 3 oz (103.5 kg)  06/21/19 225 lb (102.1 kg)   Temp Readings from Last 3 Encounters:  12/30/22 (!) 97.1 F (36.2 C) (Temporal)  06/21/19 98.3 F (36.8 C) (Oral)  06/20/19 97.7 F (36.5 C) (Oral)   BP Readings from Last 3 Encounters:  12/30/22 (!) 143/96  12/16/21 133/82  06/21/19 125/83   Pulse Readings from Last 3 Encounters:  12/30/22 81  12/16/21 83  06/21/19 68    /10  In general this is a well appearing African American male in no acute distress. He's alert and oriented x4 and appropriate throughout the examination. Cardiopulmonary assessment is negative for acute distress, and he exhibits normal effort.     KPS = 100  100 - Normal; no complaints; no evidence of disease. 90   - Able to carry on normal activity; minor signs or symptoms of disease. 80   - Normal activity with effort; some signs or symptoms of disease. 38   - Cares for self; unable to carry on normal activity or to do active work. 60   - Requires occasional assistance, but is able to care for most of his personal needs. 50   - Requires considerable assistance and frequent medical care. 40   - Disabled; requires special care and assistance. 30   - Severely disabled; hospital admission is indicated although death not imminent. 20   - Very sick; hospital admission necessary; active supportive treatment  necessary. 10   - Moribund; fatal processes progressing rapidly. 0     -  Dead  Karnofsky DA, Abelmann WH, Craver LS and Burchenal [facility] [phone]) The use of the nitrogen mustards in the palliative treatment of carcinoma: with particular reference to bronchogenic carcinoma Cancer 1 634-56  LABORATORY DATA:  Lab Results  Component Value Date   WBC 4.5 07/10/2014   HGB 14.7 07/10/2014   HCT 44.5 07/10/2014   MCV 85.2 07/10/2014   PLT 278 07/10/2014   Lab Results  Component Value Date   NA 139 06/20/2019   K 4.0 06/20/2019   CL 108 06/20/2019   CO2 23 06/20/2019   Lab Results  Component Value Date   ALT 25 06/20/2019   AST 23 06/20/2019   ALKPHOS 64 06/20/2019   BILITOT 0.6 06/20/2019     RADIOGRAPHY: No results found.    IMPRESSION/PLAN: 1. 68 y.o. gentleman with locally advanced, Stage T3b adenocarcinoma of the prostate involving the left seminal vesicle with Gleason score of 4+3, and PSA of 6.9.  The patient has elected to proceed with brachytherapy boost and use of SpaceOAR gel followed by a 5 week course of daily radiotherapy concurrent with LT-ADT for treatment of his disease. He started ADT with a 6 month Eligard injection on 01/23/23.  We reviewed the risks, benefits, short and long-term effects associated with brachytherapy and discussed the role of SpaceOAR in reducing the rectal toxicity associated with radiotherapy.  He appears to have a good understanding of his disease and our treatment recommendations which are of curative intent.  He was encouraged to ask questions that were answered to his stated satisfaction. He has freely signed written consent to proceed today in the office and a copy of this document will be placed in his medical record. His procedure is tentatively scheduled for 04/13/23 in collaboration with Dr. Renda.  We will see him back 2-3 weeks after his seed boost procedure and will proceed with CT SIM prostate for treatment planning in anticipation of beginning the 5 week course of daily external beam radiation 3-4 weeks after the seed  boost procedure.  We look forward to continuing to participate in his care. He knows that he is welcome to call with any questions or concerns at any time in the interim.  I personally spent 30 minutes in this encounter including chart review, reviewing radiological studies, meeting face-to-face with the patient, entering orders and completing documentation.    Sabra MICAEL Rusk, MMS, PA-C [facility] Radiation Oncology Physician Assistant Direct Dial: [phone]  Fax: [phone]

## 2023-03-19 ENCOUNTER — Ambulatory Visit
Admission: RE | Admit: 2023-03-19 | Discharge: 2023-03-19 | Disposition: A | Discharge status: Home / Self Care | Payer: Medicare Other | Source: Ambulatory Visit | Attending: Urology | Admitting: Urology

## 2023-03-19 ENCOUNTER — Encounter: Payer: Self-pay | Admitting: Urology

## 2023-03-19 ENCOUNTER — Ambulatory Visit
Admission: RE | Admit: 2023-03-19 | Discharge: 2023-03-19 | Discharge status: Home / Self Care | Payer: Medicare Other | Source: Ambulatory Visit | Attending: Radiation Oncology | Admitting: Radiation Oncology

## 2023-03-19 VITALS — Resp 18 | Ht 76.0 in | Wt 232.0 lb

## 2023-03-19 DIAGNOSIS — C61 Malignant neoplasm of prostate: Secondary | ICD-10-CM | POA: Insufficient documentation

## 2023-03-19 NOTE — Progress Notes (Signed)
 Pre-seed nursing interview for a diagnosis of Stage T3b adenocarcinoma of the prostate involving the left seminal vesicle with Gleason score of 4+3, and PSA of 6.9.  Patient identity verified x2.   Patient reports nocturia x3, otherwise doing well. No other related issues conveyed at this time.  Meaningful use complete.  Urinary Management medication(s)- None Urology appointment date- 04/13/2023, with Dr. Renda at [facility]  Resp 18   Ht 6' 4 (1.93 m)   Wt 232 lb (105.2 kg)   BMI 28.24 kg/m   This concludes the interaction.  Rosaline Minerva, LPN

## 2023-03-24 ENCOUNTER — Telehealth: Payer: Self-pay | Admitting: *Deleted

## 2023-03-24 NOTE — Telephone Encounter (Signed)
 Returned patient's phone call, spoke with patient

## 2023-04-10 ENCOUNTER — Encounter (HOSPITAL_BASED_OUTPATIENT_CLINIC_OR_DEPARTMENT_OTHER): Payer: Self-pay | Admitting: Urology

## 2023-04-10 ENCOUNTER — Telehealth: Payer: Self-pay | Admitting: *Deleted

## 2023-04-10 NOTE — Progress Notes (Signed)
Spoke w/ via phone for pre-op interview--- pt  Lab needs dos----   [employer], ekg      Lab results------ no COVID test -----patient states asymptomatic no test needed Arrive at ------- 1100 on 04-13-2023 NPO after MN NO Solid Food.  Clear liquids from MN until--- 1000 Med rec completed Medications to take morning of surgery -----  norvasc Diabetic medication ----- do not take metformin morning of surgery Patient instructed no nail polish to be worn day of surgery Patient instructed to bring photo id and insurance card day of surgery Patient aware to have Driver (ride ) / caregiver    for 24 hours after surgery - sister, Mitchell Ross Patient Special Instructions ----- will do one fleet enema morning of surgery Pre-Op special Instructions ----- n/a Patient verbalized understanding of instructions that were given at this phone interview. Patient denies chest pain, sob, fever, cough at the interview.

## 2023-04-10 NOTE — Telephone Encounter (Signed)
CALLED PATIENT TO REMIND OF PROCEDURE FOR 04-13-23, LVM FOR A RETURN CALL

## 2023-04-10 NOTE — H&P (Signed)
Office Visit Report     03/31/2023   --------------------------------------------------------------------------------   Mitchell Ross  MRN: [id number]  DOB: [birth year], 68 year old Male  SSN: [id number]   PRIMARY CARE:  Elfredia Nevins, MD  PRIMARY CARE FAX:  [phone]  REFERRING:  Azucena Kuba  PROVIDER:  Rutherford Nail, M.D.  TREATING:  Ulyses Amor, [place]  LOCATION:  [facility] [phone]     --------------------------------------------------------------------------------   CC/HPI: Pt presents today for pre-operative history and physical exam in anticipation of brachytherapy and space oar placement by Dr. Laverle Patter on 04/13/23. He is doing well and is without complaint except for hot flashes.   Most recent Eligard injection on 01/23/23.   Pt denies F/C, HA, CP, SOB, N/V, diarrhea/constipation, back pain, flank pain, hematuria, and dysuria.    HX:   1. Locally advanced prostate cancer  2. BPH/LUTS  3. Erectile dysfunction   Mitchell Ross returns today for follow-up after having a consultation with Dr. Kathrynn Running. He confirms his decision that he wishes to proceed with long-term androgen deprivation therapy and radiation therapy. He is scheduled for a seed implant in late January with plans and to complete external beam radiation therapy after that. His voiding symptoms remain stable off therapy.     ALLERGIES: No Allergies    MEDICATIONS: Diazepam 10 mg tablet Take 10 mg 30-60 minutes prior to your procedure  Levofloxacin 750 mg tablet Please take one tablet the morning of your biopsy.  Aspirin 325 mg tablet Oral  Centrum Silver tablet Oral  Endocet 10 mg-325 mg tablet Oral  Folic Acid 0.8 mg tablet Oral  Meloxicam 7.5 MG Oral Tablet Oral  Omega-3 180 mg-270 mg capsule Oral  Pravastatin Sodium 20 mg tablet Oral  Saw Palmetto TABS Oral  Selenium 200 MCG Oral Tablet Oral  Sildenafil 20 mg tablet 0 Oral  Zinc TABS Oral     GU PSH:  Prostate Needle Biopsy - 11/10/2022, 2022       PSH Notes: Neck Surgery, Knee Surgery   NON-GU PSH: Surgical Pathology, Gross And Microscopic Examination For Prostate Needle - 11/10/2022, 2022 Visit Complexity (formerly GPC1X) - 01/21/2023     GU PMH: Prostate Cancer - 01/21/2023, - 12/23/2022 Elevated PSA - 11/10/2022, - 07/23/2022, - 11/27/2021, - 05/08/2021, - 2022, - 2022, - 2021, Elevated prostate specific antigen (PSA), - 2016 BPH w/LUTS - 07/23/2022, - 11/27/2021, - 05/08/2021, - 2022, - 2021, Benign prostatic hyperplasia (BPH) with straining on urination, - 2016 ED due to arterial insufficiency - 07/23/2022, - 11/27/2021, - 05/08/2021, - 2022, - 2021, Erectile dysfunction due to arterial insufficiency, - 2014 Nocturia - 07/23/2022, - 11/27/2021, - 05/08/2021, - 2022, - 2021, Nocturia, - 2014 Urinary Tract Inf, Unspec site, Urinary tract infection - 2015, Pyuria, - 2015 Encounter for sterilization, Encounter for sterilization - 2014      PMH Notes:   1 Prostate cancer: He has a strong family history of prostate cancer and a history of an elevated PSA. He underwent a biopsy in 2012 when his PSA was 3.69. His PSA fluctuated by then increased back to 3.59 from 2.14 in 2021. This prompted an MRI and an MR/US fusion biopsy in January 2022 that was benign. His PSA then further increased to 6.9 in April 2024 and an MRI on 09/08/22 demonstrated 3 concerning lesions including a 1.5 cm PI-RADS 5 lesion of the left base with concern for EPE and left SV involvement. An MR/US fusion biopsy on  11/10/22 confirmed that this concerning lesion was positive in all 4 targeted cores for Gleason 4+3=7 adenocarcinoma. All ofther targeted and systematic biopsies were negative.     2) Erectile dysfunction:   Current treatment: Viagra 100 mg   3) BPH/LUTS: His baseline symptoms include a weak stream, urinary urgency and frequency.   Current treatment: None     NON-GU PMH: Bacteriuria - 11/27/2021 Unspecified viral hepatitis  C without hepatic coma, Hepatitis, C Virus - 2014    FAMILY HISTORY: Bone Cancer - Father Carcinoma Of The Stomach - Mother Prostate Cancer - Father   SOCIAL HISTORY: Marital Status: Single Preferred Language: English; Ethnicity: Not Hispanic Or Latino; Race: Black or African American Current Smoking Status: Patient smokes.   Tobacco Use Assessment Completed: Used Tobacco in last 30 days? Does not use smokeless tobacco. Does drink.  Does not use drugs. Drinks 2 caffeinated drinks per day. Has not had a blood transfusion.     Notes: Alcohol Use, Smoking Cigarettes, Occupation:, Marital History - Single  Smokes 3 cigarettes per day x 40 years  ETOH beer or wine about every 3 days    REVIEW OF SYSTEMS:    GU Review Male:   Patient denies frequent urination, hard to postpone urination, burning/ pain with urination, get up at night to urinate, leakage of urine, stream starts and stops, trouble starting your stream, have to strain to urinate , erection problems, and penile pain.  Gastrointestinal (Upper):   Patient denies nausea, vomiting, and indigestion/ heartburn.  Gastrointestinal (Lower):   Patient denies diarrhea and constipation.  Constitutional:   Patient denies fever, night sweats, weight loss, and fatigue.  Skin:   Patient denies skin rash/ lesion and itching.  Eyes:   Patient denies blurred vision and double vision.  Ears/ Nose/ Throat:   Patient denies sore throat and sinus problems.  Hematologic/Lymphatic:   Patient denies swollen glands and easy bruising.  Cardiovascular:   Patient denies leg swelling and chest pains.  Respiratory:   Patient denies cough and shortness of breath.  Endocrine:   Patient denies excessive thirst.  Musculoskeletal:   Patient denies back pain and joint pain.  Neurological:   Patient denies headaches and dizziness.  Psychologic:   Patient denies anxiety and depression.   Notes: hot flashes     VITAL SIGNS:      03/31/2023 03:17 PM  BP  129/84 mmHg  Pulse 93 /min  Temperature 97.3 F / 36.2 C   MULTI-SYSTEM PHYSICAL EXAMINATION:    Constitutional: Well-nourished. No physical deformities. Normally developed. Good grooming.  Neck: Neck symmetrical, not swollen. Normal tracheal position.  Respiratory: Normal breath sounds. No labored breathing, no use of accessory muscles.   Cardiovascular: Regular rate and rhythm. No murmur, no gallop.   Lymphatic: No enlargement of neck, axillae, groin.  Skin: No paleness, no jaundice, no cyanosis. No lesion, no ulcer, no rash.  Neurologic / Psychiatric: Oriented to time, oriented to place, oriented to person. No depression, no anxiety, no agitation.  Gastrointestinal: No mass, no tenderness, no rigidity, non obese abdomen.  Eyes: Normal conjunctivae. Normal eyelids.  Ears, Nose, Mouth, and Throat: Left ear no scars, no lesions, no masses. Right ear no scars, no lesions, no masses. Nose no scars, no lesions, no masses. Normal hearing. Normal lips.  Musculoskeletal: Normal gait and station of head and neck.     Complexity of Data:  Records Review:   Previous Patient Records  Urine Test Review:   Urinalysis   03/31/23  Urinalysis  Urine Appearance Clear   Urine Color Yellow   Urine Glucose Neg mg/dL  Urine Bilirubin Neg mg/dL  Urine Ketones Trace mg/dL  Urine Specific Gravity 1.030   Urine Blood Neg ery/uL  Urine pH <=5.0   Urine Protein 2+ mg/dL  Urine Urobilinogen 1.0 mg/dL  Urine Nitrites Neg   Urine Leukocyte Esterase Neg leu/uL  Urine WBC/hpf 0 - 5/hpf   Urine RBC/hpf 0 - 2/hpf   Urine Epithelial Cells 6 - 10/hpf   Urine Bacteria Rare (0-9/hpf)   Urine Mucous Present   Urine Yeast NS (Not Seen)   Urine Trichomonas Not Present   Urine Cystals Ca Oxalate   Urine Casts NS (Not Seen)   Urine Sperm Not Present    PROCEDURES:          Urinalysis w/Scope - 81001 Dipstick Dipstick Cont'd Micro  Color: Yellow Bilirubin: Neg mg/dL WBC/hpf: 0 - 5/hpf  Appearance: Clear  Ketones: Trace mg/dL RBC/hpf: 0 - 2/hpf  Specific Gravity: 1.030 Blood: Neg ery/uL Bacteria: Rare (0-9/hpf)  pH: <=5.0 Protein: 2+ mg/dL Cystals: Ca Oxalate  Glucose: Neg mg/dL Urobilinogen: 1.0 mg/dL Casts: NS (Not Seen)    Nitrites: Neg Trichomonas: Not Present    Leukocyte Esterase: Neg leu/uL Mucous: Present      Epithelial Cells: 6 - 10/hpf      Yeast: NS (Not Seen)      Sperm: Not Present    ASSESSMENT:      ICD-10 Details  1 GU:   Prostate Cancer - C61    PLAN:           Orders Labs Urine Culture          Schedule Return Visit/Planned Activity: Keep Scheduled Appointment - Schedule Surgery          Document Letter(s):  Created for Patient: Clinical Summary         Notes:   There are no changes in the patients history or physical exam since last evaluation by Dr. Laverle Patter. Pt is scheduled to undergo seed and space oar placement on 04/13/23.   Urine culture today.   All pt's questions were answered to the best of my ability.          Next Appointment:      Next Appointment: 04/13/2023 01:00 PM    Appointment Type: Surgery     Location: [facility] [phone]    Provider: Heloise Purpura, M.D.    Reason for Visit: NE/OP BRACHYTHERAPY AND SPACE OAR      * Signed by Ulyses Amor, PA on 03/31/23 at 3:34 PM (EST)*

## 2023-04-13 ENCOUNTER — Encounter (HOSPITAL_BASED_OUTPATIENT_CLINIC_OR_DEPARTMENT_OTHER): Payer: Self-pay | Admitting: Urology

## 2023-04-13 ENCOUNTER — Encounter (HOSPITAL_BASED_OUTPATIENT_CLINIC_OR_DEPARTMENT_OTHER)
Admission: RE | Disposition: A | Discharge status: Home / Self Care | Payer: Self-pay | Source: Home / Self Care | Attending: Urology

## 2023-04-13 ENCOUNTER — Ambulatory Visit (HOSPITAL_BASED_OUTPATIENT_CLINIC_OR_DEPARTMENT_OTHER): Payer: Medicare Other | Admitting: Anesthesiology

## 2023-04-13 ENCOUNTER — Ambulatory Visit (HOSPITAL_BASED_OUTPATIENT_CLINIC_OR_DEPARTMENT_OTHER)
Admission: RE | Admit: 2023-04-13 | Discharge: 2023-04-13 | Disposition: A | Discharge status: Home / Self Care | Payer: Medicare Other | Attending: Urology | Admitting: Urology

## 2023-04-13 ENCOUNTER — Ambulatory Visit (HOSPITAL_COMMUNITY): Payer: Medicare Other

## 2023-04-13 DIAGNOSIS — Z8042 Family history of malignant neoplasm of prostate: Secondary | ICD-10-CM | POA: Diagnosis not present

## 2023-04-13 DIAGNOSIS — R3915 Urgency of urination: Secondary | ICD-10-CM | POA: Diagnosis not present

## 2023-04-13 DIAGNOSIS — E119 Type 2 diabetes mellitus without complications: Secondary | ICD-10-CM

## 2023-04-13 DIAGNOSIS — R3912 Poor urinary stream: Secondary | ICD-10-CM | POA: Insufficient documentation

## 2023-04-13 DIAGNOSIS — I10 Essential (primary) hypertension: Secondary | ICD-10-CM | POA: Diagnosis not present

## 2023-04-13 DIAGNOSIS — Z01818 Encounter for other preprocedural examination: Secondary | ICD-10-CM

## 2023-04-13 DIAGNOSIS — N529 Male erectile dysfunction, unspecified: Secondary | ICD-10-CM | POA: Insufficient documentation

## 2023-04-13 DIAGNOSIS — C61 Malignant neoplasm of prostate: Secondary | ICD-10-CM | POA: Insufficient documentation

## 2023-04-13 DIAGNOSIS — F17210 Nicotine dependence, cigarettes, uncomplicated: Secondary | ICD-10-CM | POA: Diagnosis not present

## 2023-04-13 DIAGNOSIS — N401 Enlarged prostate with lower urinary tract symptoms: Secondary | ICD-10-CM | POA: Diagnosis not present

## 2023-04-13 HISTORY — DX: Other chronic pain: G89.29

## 2023-04-13 HISTORY — PX: RADIOACTIVE SEED IMPLANT: SHX5150

## 2023-04-13 HISTORY — DX: Unspecified osteoarthritis, unspecified site: M19.90

## 2023-04-13 HISTORY — DX: Hyperlipidemia, unspecified: E78.5

## 2023-04-13 HISTORY — DX: Benign prostatic hyperplasia with lower urinary tract symptoms: N40.1

## 2023-04-13 HISTORY — DX: Male erectile dysfunction, unspecified: N52.9

## 2023-04-13 HISTORY — DX: Personal history of adenomatous and serrated colon polyps: Z86.0101

## 2023-04-13 HISTORY — DX: Type 2 diabetes mellitus without complications: E11.9

## 2023-04-13 LAB — POCT I-STAT, CHEM 8
BUN: 14 mg/dL (ref 8–23)
Calcium, Ion: 1.24 mmol/L (ref 1.15–1.40)
Chloride: 108 mmol/L (ref 98–111)
Creatinine, Ser: 0.9 mg/dL (ref 0.61–1.24)
Glucose, Bld: 116 mg/dL — ABNORMAL HIGH (ref 70–99)
HCT: 45 % (ref 39.0–52.0)
Hemoglobin: 15.3 g/dL (ref 13.0–17.0)
Potassium: 3.8 mmol/L (ref 3.5–5.1)
Sodium: 146 mmol/L — ABNORMAL HIGH (ref 135–145)
TCO2: 23 mmol/L (ref 22–32)

## 2023-04-13 SURGERY — INSERTION, RADIATION SOURCE, PROSTATE
Anesthesia: General | Site: Prostate

## 2023-04-13 MED ORDER — FENTANYL CITRATE (PF) 100 MCG/2ML IJ SOLN
INTRAMUSCULAR | Status: AC
  Filled 2023-04-13: qty 2

## 2023-04-13 MED ORDER — TRAMADOL HCL 50 MG PO TABS: 50.0000 mg | ORAL_TABLET | Freq: Four times a day (QID) | ORAL | 0 refills | Status: AC | PRN

## 2023-04-13 MED ORDER — FENTANYL CITRATE (PF) 100 MCG/2ML IJ SOLN
INTRAMUSCULAR | Status: DC | PRN
  Administered 2023-04-13: 100 ug via INTRAVENOUS

## 2023-04-13 MED ORDER — PROPOFOL 10 MG/ML IV BOLUS
INTRAVENOUS | Status: DC | PRN
  Administered 2023-04-13: 160 mg via INTRAVENOUS

## 2023-04-13 MED ORDER — LACTATED RINGERS IV SOLN: INTRAVENOUS | Status: DC

## 2023-04-13 MED ORDER — MIDAZOLAM HCL 5 MG/5ML IJ SOLN
INTRAMUSCULAR | Status: DC | PRN
  Administered 2023-04-13: 2 mg via INTRAVENOUS

## 2023-04-13 MED ORDER — CIPROFLOXACIN IN D5W 400 MG/200ML IV SOLN
400.0000 mg | INTRAVENOUS | Status: AC
  Administered 2023-04-13: 400 mg via INTRAVENOUS

## 2023-04-13 MED ORDER — ROCURONIUM BROMIDE 10 MG/ML (PF) SYRINGE
PREFILLED_SYRINGE | INTRAVENOUS | Status: DC | PRN
  Administered 2023-04-13: 60 mg via INTRAVENOUS
  Administered 2023-04-13: 10 mg via INTRAVENOUS

## 2023-04-13 MED ORDER — IOHEXOL 300 MG/ML  SOLN
INTRAMUSCULAR | Status: DC | PRN
  Administered 2023-04-13: 7 mL

## 2023-04-13 MED ORDER — LIDOCAINE HCL (PF) 2 % IJ SOLN
INTRAMUSCULAR | Status: AC
  Filled 2023-04-13: qty 5

## 2023-04-13 MED ORDER — SUGAMMADEX SODIUM 200 MG/2ML IV SOLN
INTRAVENOUS | Status: DC | PRN
  Administered 2023-04-13: 200 mg via INTRAVENOUS

## 2023-04-13 MED ORDER — PROPOFOL 10 MG/ML IV BOLUS
INTRAVENOUS | Status: AC
  Filled 2023-04-13: qty 20

## 2023-04-13 MED ORDER — FLEET ENEMA RE ENEM: 1.0000 | ENEMA | Freq: Once | RECTAL | Status: DC

## 2023-04-13 MED ORDER — TAMSULOSIN HCL 0.4 MG PO CAPS: 0.4000 mg | ORAL_CAPSULE | Freq: Every day | ORAL | 0 refills | Status: DC

## 2023-04-13 MED ORDER — PHENYLEPHRINE 80 MCG/ML (10ML) SYRINGE FOR IV PUSH (FOR BLOOD PRESSURE SUPPORT)
PREFILLED_SYRINGE | INTRAVENOUS | Status: DC | PRN
  Administered 2023-04-13 (×3): 80 ug via INTRAVENOUS
  Administered 2023-04-13: 160 ug via INTRAVENOUS

## 2023-04-13 MED ORDER — SODIUM CHLORIDE 0.9 % IR SOLN
Status: DC | PRN
  Administered 2023-04-13: 200 mL

## 2023-04-13 MED ORDER — ONDANSETRON HCL 4 MG/2ML IJ SOLN
INTRAMUSCULAR | Status: DC | PRN
  Administered 2023-04-13: 4 mg via INTRAVENOUS

## 2023-04-13 MED ORDER — DEXMEDETOMIDINE HCL IN NACL 80 MCG/20ML IV SOLN
INTRAVENOUS | Status: DC | PRN
  Administered 2023-04-13: 8 ug via INTRAVENOUS

## 2023-04-13 MED ORDER — PHENYLEPHRINE 80 MCG/ML (10ML) SYRINGE FOR IV PUSH (FOR BLOOD PRESSURE SUPPORT)
PREFILLED_SYRINGE | INTRAVENOUS | Status: AC
  Filled 2023-04-13: qty 10

## 2023-04-13 MED ORDER — STERILE WATER FOR IRRIGATION IR SOLN
Status: DC | PRN
  Administered 2023-04-13: 500 mL

## 2023-04-13 MED ORDER — MIDAZOLAM HCL 2 MG/2ML IJ SOLN
INTRAMUSCULAR | Status: AC
  Filled 2023-04-13: qty 2

## 2023-04-13 MED ORDER — LIDOCAINE 2% (20 MG/ML) 5 ML SYRINGE
INTRAMUSCULAR | Status: DC | PRN
  Administered 2023-04-13: 60 mg via INTRAVENOUS

## 2023-04-13 MED ORDER — ONDANSETRON HCL 4 MG/2ML IJ SOLN
INTRAMUSCULAR | Status: AC
  Filled 2023-04-13: qty 2

## 2023-04-13 MED ORDER — CIPROFLOXACIN IN D5W 400 MG/200ML IV SOLN
INTRAVENOUS | Status: AC
  Filled 2023-04-13: qty 200

## 2023-04-13 MED ORDER — ROCURONIUM BROMIDE 10 MG/ML (PF) SYRINGE
PREFILLED_SYRINGE | INTRAVENOUS | Status: AC
  Filled 2023-04-13: qty 10

## 2023-04-13 MED ORDER — DEXAMETHASONE SODIUM PHOSPHATE 10 MG/ML IJ SOLN
INTRAMUSCULAR | Status: DC | PRN
  Administered 2023-04-13: 5 mg via INTRAVENOUS

## 2023-04-13 SURGICAL SUPPLY — 33 items
BAG URINE DRAIN 2000ML AR STRL (UROLOGICAL SUPPLIES) ×3 IMPLANT
BLADE CLIPPER SENSICLIP SURGIC (BLADE) ×3 IMPLANT
CATH FOLEY 2WAY SLVR 5CC 16FR (CATHETERS) ×3 IMPLANT
CATH ROBINSON RED A/P 20FR (CATHETERS) ×3 IMPLANT
CLOTH BEACON ORANGE TIMEOUT ST (SAFETY) ×3 IMPLANT
CNTNR URN SCR LID CUP LEK RST (MISCELLANEOUS) ×3 IMPLANT
COVER BACK TABLE 60X90IN (DRAPES) ×3 IMPLANT
COVER MAYO STAND STRL (DRAPES) ×3 IMPLANT
DRSG TEGADERM 4X4.75 (GAUZE/BANDAGES/DRESSINGS) ×3 IMPLANT
DRSG TEGADERM 8X12 (GAUZE/BANDAGES/DRESSINGS) ×3 IMPLANT
GAUZE SPONGE 4X4 12PLY STRL (GAUZE/BANDAGES/DRESSINGS) ×1 IMPLANT
GLOVE BIO SURGEON STRL SZ7.5 (GLOVE) ×6 IMPLANT
GLOVE BIOGEL PI IND STRL 6.5 (GLOVE) ×1 IMPLANT
GLOVE BIOGEL PI IND STRL 7.0 (GLOVE) ×1 IMPLANT
GLOVE SURG ORTHO 8.5 STRL (GLOVE) ×5 IMPLANT
GLOVE SURG SS PI 7.0 STRL IVOR (GLOVE) ×1 IMPLANT
GOWN STRL REUS W/TWL LRG LVL3 (GOWN DISPOSABLE) ×6 IMPLANT
GRID BRACH TEMP 18GA 2.8X3X.75 (MISCELLANEOUS) ×3 IMPLANT
I-125 Seeds ×64 IMPLANT
IV NS 250ML BAXH (IV SOLUTION) ×1 IMPLANT
KIT TURNOVER CYSTO (KITS) ×3 IMPLANT
NDL BRACHY 18G 5PK (NEEDLE) ×8 IMPLANT
NDL PK MORGANSTERN STABILIZ (NEEDLE) ×2 IMPLANT
NEEDLE BRACHY 18G 5PK (NEEDLE) ×8
NEEDLE PK MORGANSTERN STABILIZ (NEEDLE) ×2
PACK CYSTO (CUSTOM PROCEDURE TRAY) ×3 IMPLANT
SHEATH ULTRASOUND LTX NONSTRL (SHEATH) ×1 IMPLANT
SLEEVE SCD COMPRESS KNEE MED (STOCKING) ×3 IMPLANT
SPIKE FLUID TRANSFER (MISCELLANEOUS) ×2 IMPLANT
SYR 10ML LL (SYRINGE) ×3 IMPLANT
TOWEL OR 17X24 6PK STRL BLUE (TOWEL DISPOSABLE) ×3 IMPLANT
UNDERPAD 30X36 HEAVY ABSORB (UNDERPADS AND DIAPERS) ×6 IMPLANT
WATER STERILE IRR 500ML POUR (IV SOLUTION) ×3 IMPLANT

## 2023-04-13 NOTE — Anesthesia Procedure Notes (Signed)
Procedure Name: Intubation Date/Time: 04/13/2023 1:16 PM  Performed by: Bishop Limbo, CRNAPre-anesthesia Checklist: Patient identified, Emergency Drugs available, Suction available and Patient being monitored Patient Re-evaluated:Patient Re-evaluated prior to induction Oxygen Delivery Method: Circle System Utilized Preoxygenation: Pre-oxygenation with 100% oxygen Induction Type: IV induction Ventilation: Mask ventilation without difficulty Laryngoscope Size: Glidescope and 4 Grade View: Grade I Tube type: Oral Tube size: 7.5 mm Number of attempts: 1 Airway Equipment and Method: Video-laryngoscopy and Rigid stylet Placement Confirmation: ETT inserted through vocal cords under direct vision, positive ETCO2 and breath sounds checked- equal and bilateral Secured at: 22 cm Tube secured with: Tape Dental Injury: Teeth and Oropharynx as per pre-operative assessment  Comments: Elective GlideScope use for history of previous Gr 4 view with Mac 4 atttempt. (2014).

## 2023-04-13 NOTE — Op Note (Signed)
Preoperative diagnosis: Clinically localized adenocarcinoma of the prostate (T3b N0 M0)  Postoperative diagnosis: Clinically localized adenocarcinoma of the prostate  Procedure: 1) Transperineal placement of radioactive seeds into the prostate                    2) Cystoscopy  Surgeon: Moody Bruins. M.D.  Radiation oncologist: Margaretmary Dys, M.D.  Anesthesia: General  EBL: Minimal  Complications: None  Indication: Mitchell Ross is a 68 y.o. gentleman with high risk prostate cancer. After discussing management options for treatment, he elected to proceed with radiotherapy. He presents today for the above procedures. The potential risks, complications, alternative options, and expected recovery course have been discussed in detail with the patient and he has provided informed consent to proceed.  Description of procedure: The patient was taken to the operating room and general anesthesia was induced. He was administered preoperative antibiotics, placed in the dorsal lithotomy position, and prepped and draped in the usual sterile fashion. Next, intraoperative transrectal ultrasonography was utilized for real-time intraoperative planning by the radiation oncology team. Once the treatment plan was completed and the seed strands created, stranded iodine 125 radiation seeds were placed utilizing a brachytherapy perineal template. 64 radioactive iodine 125 seeds into the prostate through 18 catheter needles.  The brachytherapy template was then removed.  Position of the radiation seeds was confirmed on fluoroscopic imaging.  Flexible cystoscopy was then performed and no seeds were identified within the bladder.  No bladder tumors, stones, or other mucosal pathology was identified within the bladder. He tolerated the procedure well and without complications. He was able to be transferred to the recovery unit in satisfactory condition.  He was given a voiding trial in the PACU.

## 2023-04-13 NOTE — Discharge Instructions (Addendum)
You will be prescribed tamsulosin which is a medication to help you urinate over the next month.  You should call Dr. Vevelyn Royals office [phone]) if you feel you cannot empty your bladder well. Also, call if you develop fever > 101.  You will also be prescribed pain medication and should take an over the counter stool softener over the next week to avoid straining with bowel movements.  Followup with Dr. Laverle Patter and your radiation oncologist as scheduled.  Post Anesthesia Home Care Instructions  Activity: Get plenty of rest for the remainder of the day. A responsible adult should stay with you for 24 hours following the procedure.  For the next 24 hours, DO NOT: -Drive a car -Advertising copywriter -Drink alcoholic beverages -Take any medication unless instructed by your physician -Make any legal decisions or sign important papers.  Meals: Start with liquid foods such as gelatin or soup. Progress to regular foods as tolerated. Avoid greasy, spicy, heavy foods. If nausea and/or vomiting occur, drink only clear liquids until the nausea and/or vomiting subsides. Call your physician if vomiting continues.  Special Instructions/Symptoms: Your throat may feel dry or sore from the anesthesia or the breathing tube placed in your throat during surgery. If this causes discomfort, gargle with warm salt water. The discomfort should disappear within 24 hours.

## 2023-04-13 NOTE — Interval H&P Note (Signed)
History and Physical Interval Note:  04/13/2023 12:51 PM  Mitchell Ross  has presented today for surgery, with the diagnosis of PROSTATE CANCER.  The various methods of treatment have been discussed with the patient and family. After consideration of risks, benefits and other options for treatment, the patient has consented to  Procedure(s): RADIOACTIVE SEED IMPLANT/BRACHYTHERAPY IMPLANT (N/A) SPACE OAR INSTILLATION (N/A) as a surgical intervention.  The patient's history has been reviewed, patient examined, no change in status, stable for surgery.  I have reviewed the patient's chart and labs.  Questions were answered to the patient's satisfaction.     [employer]

## 2023-04-13 NOTE — Transfer of Care (Signed)
Immediate Anesthesia Transfer of Care Note  Patient: Mitchell Ross  Procedure(s) Performed: RADIOACTIVE SEED IMPLANT/BRACHYTHERAPY IMPLANT (Prostate)  Patient Location: PACU  Anesthesia Type:General  Level of Consciousness: awake, alert , oriented, and patient cooperative  Airway & Oxygen Therapy: Patient Spontanous Breathing  Post-op Assessment: Report given to RN and Post -op Vital signs reviewed and stable  Post vital signs: Reviewed and stable  Last Vitals:  Vitals Value Taken Time  BP 137/101 04/13/23 1442  Temp    Pulse 76 04/13/23 1443  Resp 13 04/13/23 1443  SpO2 97 % 04/13/23 1443  Vitals shown include unfiled device data.  Last Pain:  Vitals:   04/13/23 1112  TempSrc: Oral  PainSc: 3       Patients Stated Pain Goal: 3 (04/13/23 1112)  Complications: No notable events documented.

## 2023-04-13 NOTE — Anesthesia Preprocedure Evaluation (Signed)
Anesthesia Evaluation  Patient identified by MRN, date of birth, ID band Patient awake    Reviewed: Allergy & Precautions, NPO status , Patient's Chart, lab work & pertinent test results  Airway Mallampati: II  TM Distance: >3 FB Neck ROM: Full    Dental no notable dental hx.    Pulmonary neg pulmonary ROS, Current Smoker   Pulmonary exam normal        Cardiovascular hypertension, Pt. on medications  Rhythm:Regular Rate:Normal     Neuro/Psych negative neurological ROS  negative psych ROS   GI/Hepatic negative GI ROS,,,(+) Hepatitis -, C  Endo/Other  diabetes, Type 2, Oral Hypoglycemic Agents    Renal/GU  Bladder dysfunction      Musculoskeletal  (+) Arthritis , Osteoarthritis,    Abdominal Normal abdominal exam  (+)   Peds  Hematology Lab Results      Component                Value               Date                      WBC                      4.5                 07/10/2014                HGB                      14.7                07/10/2014                HCT                      44.5                07/10/2014                MCV                      85.2                07/10/2014                PLT                      278                 07/10/2014              Anesthesia Other Findings   Reproductive/Obstetrics                             Anesthesia Physical Anesthesia Plan  ASA: 3  Anesthesia Plan: General   Post-op Pain Management:    Induction: Intravenous  PONV Risk Score and Plan: 1 and Ondansetron, Dexamethasone and Treatment may vary due to age or medical condition  Airway Management Planned: Mask and LMA  Additional Equipment: None  Intra-op Plan:   Post-operative Plan: Extubation in OR  Informed Consent: I have reviewed the patients History and Physical, chart, labs and discussed the procedure including the risks, benefits and alternatives for the proposed  anesthesia with the patient  or authorized representative who has indicated his/her understanding and acceptance.     Dental advisory given  Plan Discussed with: CRNA  Anesthesia Plan Comments:        Anesthesia Quick Evaluation

## 2023-04-14 NOTE — Anesthesia Postprocedure Evaluation (Signed)
Anesthesia Post Note  Patient: Mitchell Ross  Procedure(s) Performed: RADIOACTIVE SEED IMPLANT/BRACHYTHERAPY IMPLANT (Prostate)     Patient location during evaluation: PACU Anesthesia Type: General Level of consciousness: awake and alert Pain management: pain level controlled Vital Signs Assessment: post-procedure vital signs reviewed and stable Respiratory status: spontaneous breathing, nonlabored ventilation, respiratory function stable and patient connected to nasal cannula oxygen Cardiovascular status: blood pressure returned to baseline and stable Postop Assessment: no apparent nausea or vomiting Anesthetic complications: no   No notable events documented.  Last Vitals:  Vitals:   04/13/23 1500 04/13/23 1545  BP: (!) 144/101 (!) 151/89  Pulse:  65  Resp: 15 16  Temp:    SpO2:  99%    Last Pain:  Vitals:   04/13/23 1545  TempSrc:   PainSc: 0-No pain                 Earl Lites P Eilleen Davoli

## 2023-04-14 NOTE — Progress Notes (Signed)
Radiation Oncology         [phone] ________________________________  Name: Mitchell Ross MRN: [id number]  Date: 04/14/2023  DOB: December 23, 1955       Prostate Seed Implant  WU:[id number], Lyman Bishop, MD  No ref. provider found  DIAGNOSIS:   68 y.o. gentleman with locally advanced, Stage T3b adenocarcinoma of the prostate involving the left seminal vesicle with Gleason score of 4+3, and PSA of 6.9.   Oncology History  Malignant neoplasm of prostate (HCC)  11/10/2022 Cancer Staging   Staging form: Prostate, AJCC 8th Edition - Clinical stage from 11/10/2022: Stage IIIB (cT3b, cN0, cM0, PSA: 6.9, Grade Group: 3) - Signed by Marcello Fennel, PA-C on 12/29/2022 Histopathologic type: Adenocarcinoma, NOS Stage prefix: Initial diagnosis Prostate specific antigen (PSA) range: Less than 10 Gleason primary pattern: 4 Gleason secondary pattern: 3 Gleason score: 7 Histologic grading system: 5 grade system Number of biopsy cores examined: 24 Number of biopsy cores positive: 4 Location of positive needle core biopsies: One side   12/29/2022 Initial Diagnosis   Malignant neoplasm of prostate (HCC)       ICD-10-CM   1. Pre-op testing  Z01.818 EKG 12 lead per protocol    EKG 12 lead per protocol    CANCELED: CBG per Guidelines for Diabetes Management for Patients Undergoing Surgery ([facility], [facility], and [facility] only)    CANCELED: CBG per protocol    CANCELED: I-Stat, Chem 8 on day of surgery per protocol    CANCELED: CBG per Guidelines for Diabetes Management for Patients Undergoing Surgery ([facility], [facility], and [facility] only)    CANCELED: CBG per protocol    CANCELED: I-Stat, Chem 8 on day of surgery per protocol      PROCEDURE: Insertion of radioactive I-125 seeds into the prostate gland.  RADIATION DOSE: 110 Gy, boost therapy.  TECHNIQUE: DRESHON PROFFIT was brought to the operating room with the urologist. He was placed in the dorsolithotomy position. He was catheterized and a rectal tube was inserted. The perineum  was shaved, prepped and draped. The ultrasound probe was then introduced by me into the rectum to see the prostate gland.  TREATMENT DEVICE: I attached the needle grid to the ultrasound probe stand and anchor needles were placed.  3D PLANNING: The prostate was imaged in 3D using a sagittal sweep of the prostate probe. These images were transferred to the planning computer. There, the prostate, urethra and rectum were defined on each axial reconstructed image. Then, the software created an optimized 3D plan and a few seed positions were adjusted. The quality of the plan was reviewed using [facility] information for the target and the following two organs at risk:  Urethra and Rectum.  Then the accepted plan was printed and handed off to the radiation therapist.  Under my supervision, the custom loading of the seeds and spacers was carried out using the quick loader.  These pre-loaded needles were then placed into the needle holder.Marland Kitchen  PROSTATE VOLUME STUDY:  Using transrectal ultrasound the volume of the prostate was verified to be 34.6 cc.  SPECIAL TREATMENT PROCEDURE/SUPERVISION AND HANDLING: The pre-loaded needles were then delivered by the urologist under sagittal guidance. A total of 18 needles were used to deposit 34 seeds in the prostate gland. The individual seed activity was 0.323 mCi.  SpaceOAR:  Yes  COMPLEX SIMULATION: At the end of the procedure, an anterior radiograph of the pelvis was obtained to document seed positioning and count. Cystoscopy was performed by the urologist to check the urethra  and bladder.  MICRODOSIMETRY: At the end of the procedure, the patient was emitting 0.07 mR/hr at 1 meter. Accordingly, he was considered safe for hospital discharge.  PLAN: The patient will return to the radiation oncology clinic for post implant CT dosimetry in three weeks.   ________________________________  Artist Pais Kathrynn Running, M.D.

## 2023-04-15 ENCOUNTER — Encounter (HOSPITAL_BASED_OUTPATIENT_CLINIC_OR_DEPARTMENT_OTHER): Payer: Self-pay | Admitting: Urology

## 2023-04-20 NOTE — Progress Notes (Signed)
RN spoke with patient to review next steps of CT Simulation and 5 weeks of daily radiation.  All questions answered.

## 2023-04-24 ENCOUNTER — Telehealth: Payer: Self-pay | Admitting: *Deleted

## 2023-04-24 NOTE — Telephone Encounter (Signed)
 CALLED PATIENT TO REMIND OF SIM/POST SEED APPT. FOR 04-27-23- ARRIVAL TIME- 12:45 PM, LVM FOR A RETURN CALL

## 2023-04-27 ENCOUNTER — Ambulatory Visit
Admission: RE | Admit: 2023-04-27 | Discharge: 2023-04-27 | Disposition: A | Discharge status: Home / Self Care | Payer: Medicare Other | Source: Ambulatory Visit | Attending: Urology | Admitting: Urology

## 2023-04-27 DIAGNOSIS — C61 Malignant neoplasm of prostate: Secondary | ICD-10-CM | POA: Diagnosis present

## 2023-04-27 NOTE — Progress Notes (Signed)
  Radiation Oncology         [phone] ________________________________  Name: Mitchell Ross MRN: [id number]  Date: 04/27/2023  DOB: 1955/08/23  SIMULATION AND TREATMENT PLANNING NOTE    ICD-10-CM   1. Malignant neoplasm of prostate (HCC)  C61       DIAGNOSIS:  68 y.o. gentleman with locally advanced, Stage T3b adenocarcinoma of the prostate involving the left seminal vesicle with Gleason score of 4+3, and PSA of 6.9.   NARRATIVE:  The patient was brought to the CT Simulation planning suite.  Identity was confirmed.  All relevant records and images related to the planned course of therapy were reviewed.  The patient freely provided informed written consent to proceed with treatment after reviewing the details related to the planned course of therapy. The consent form was witnessed and verified by the simulation staff.  Then, the patient was set-up in a stable reproducible supine position for radiation therapy.  A vacuum lock pillow device was custom fabricated to position his legs in a reproducible immobilized position.  Then, I performed a urethrogram under sterile conditions to identify the prostatic apex.  CT images were obtained.  Surface markings were placed.  The CT images were loaded into the planning software.  Then the prostate target and avoidance structures including the rectum, bladder, bowel and hips were contoured.  Treatment planning then occurred.  The radiation prescription was entered and confirmed.  A total of one complex treatment devices were fabricated. I have requested : Intensity Modulated Radiotherapy (IMRT) is medically necessary for this case for the following reason:  Rectal sparing.Aaron Aas  PLAN:  The patient will receive 45 Gy in 25 fractions of 1.8 Gy, to supplement an up-front prostate seed implant boost of 110 Gy to achieve a total nominal dose of 155 Gy.  ________________________________  Trilby Fujisawa Lorri Rota, M.D.

## 2023-04-27 NOTE — Progress Notes (Signed)
  Radiation Oncology         [phone] ________________________________  Name: Mitchell LASSONDE MRN: [id number]  Date: 04/27/2023  DOB: 01/07/1956  COMPLEX SIMULATION NOTE  NARRATIVE:  The patient was brought to the CT Simulation planning suite today following prostate seed implantation approximately one month ago.  Identity was confirmed.  All relevant records and images related to the planned course of therapy were reviewed.  Then, the patient was set-up supine.  CT images were obtained.  The CT images were loaded into the planning software.  Then the prostate and rectum were contoured.  Treatment planning then occurred.  The implanted iodine 125 seeds were identified by the physics staff for projection of radiation distribution  I have requested : 3D Simulation  I have requested a DVH of the following structures: Prostate and rectum.    ________________________________  Trilby Fujisawa Lorri Rota, M.D.

## 2023-04-30 ENCOUNTER — Encounter: Payer: Self-pay | Admitting: Radiation Oncology

## 2023-04-30 DIAGNOSIS — C61 Malignant neoplasm of prostate: Secondary | ICD-10-CM | POA: Diagnosis not present

## 2023-04-30 NOTE — Progress Notes (Signed)
  Radiation Oncology         [phone]) [phone] ________________________________  Name: Mitchell Ross MRN: [id number]  Date: 04/30/2023  DOB: 1955/11/01  3D Planning Note   Prostate Brachytherapy Post-Implant Dosimetry  Diagnosis: 68 y.o. gentleman with locally advanced, Stage T3b adenocarcinoma of the prostate involving the left seminal vesicle with Gleason score of 4+3, and PSA of 6.9.   Narrative: On a previous date, Mitchell Ross returned following prostate seed implantation for post implant planning. He underwent CT scan complex simulation to delineate the three-dimensional structures of the pelvis and demonstrate the radiation distribution.  Since that time, the seed localization, and complex isodose planning with dose volume histograms have now been completed.  Results:   Prostate Coverage - The dose of radiation delivered to the 90% or more of the prostate gland (D90) was 110.68% of the prescription dose. This exceeds our goal of greater than 90%. Rectal Sparing - The volume of rectal tissue receiving the prescription dose or higher was 0.0 cc. This falls under our thresholds tolerance of 1.0 cc.  Impression: The prostate seed implant appears to show adequate target coverage and appropriate rectal sparing.  Plan:  The patient will continue to follow with urology for ongoing PSA determinations. I would anticipate a high likelihood for local tumor control with minimal risk for rectal morbidity.  ________________________________  Artist Pais Kathrynn Running, M.D.

## 2023-05-01 DIAGNOSIS — C61 Malignant neoplasm of prostate: Secondary | ICD-10-CM | POA: Diagnosis not present

## 2023-05-06 ENCOUNTER — Ambulatory Visit: Payer: Medicare Other

## 2023-05-07 ENCOUNTER — Ambulatory Visit: Payer: Medicare Other

## 2023-05-08 ENCOUNTER — Ambulatory Visit: Payer: Medicare Other

## 2023-05-11 ENCOUNTER — Other Ambulatory Visit: Payer: Self-pay

## 2023-05-11 ENCOUNTER — Ambulatory Visit
Admission: RE | Admit: 2023-05-11 | Discharge: 2023-05-11 | Disposition: A | Discharge status: Home / Self Care | Payer: Medicare Other | Source: Ambulatory Visit | Attending: Radiation Oncology | Admitting: Radiation Oncology

## 2023-05-11 DIAGNOSIS — C61 Malignant neoplasm of prostate: Secondary | ICD-10-CM | POA: Diagnosis not present

## 2023-05-11 LAB — RAD ONC ARIA SESSION SUMMARY
Course Elapsed Days: 0
Plan Fractions Treated to Date: 1
Plan Prescribed Dose Per Fraction: 1.8 Gy
Plan Total Fractions Prescribed: 25
Plan Total Prescribed Dose: 45 Gy
Reference Point Dosage Given to Date: 1.8 Gy
Reference Point Session Dosage Given: 1.8 Gy
Session Number: 1

## 2023-05-12 ENCOUNTER — Ambulatory Visit
Admission: RE | Admit: 2023-05-12 | Discharge: 2023-05-12 | Disposition: A | Discharge status: Home / Self Care | Payer: Medicare Other | Source: Ambulatory Visit | Attending: Radiation Oncology

## 2023-05-12 ENCOUNTER — Other Ambulatory Visit: Payer: Self-pay

## 2023-05-12 DIAGNOSIS — C61 Malignant neoplasm of prostate: Secondary | ICD-10-CM | POA: Diagnosis not present

## 2023-05-12 LAB — RAD ONC ARIA SESSION SUMMARY
Course Elapsed Days: 1
Plan Fractions Treated to Date: 2
Plan Prescribed Dose Per Fraction: 1.8 Gy
Plan Total Fractions Prescribed: 25
Plan Total Prescribed Dose: 45 Gy
Reference Point Dosage Given to Date: 3.6 Gy
Reference Point Session Dosage Given: 1.8 Gy
Session Number: 2

## 2023-05-13 ENCOUNTER — Other Ambulatory Visit: Payer: Self-pay

## 2023-05-13 ENCOUNTER — Ambulatory Visit
Admission: RE | Admit: 2023-05-13 | Discharge: 2023-05-13 | Disposition: A | Discharge status: Home / Self Care | Payer: Medicare Other | Source: Ambulatory Visit | Attending: Radiation Oncology

## 2023-05-13 DIAGNOSIS — C61 Malignant neoplasm of prostate: Secondary | ICD-10-CM | POA: Diagnosis not present

## 2023-05-13 LAB — RAD ONC ARIA SESSION SUMMARY
Course Elapsed Days: 2
Plan Fractions Treated to Date: 3
Plan Prescribed Dose Per Fraction: 1.8 Gy
Plan Total Fractions Prescribed: 25
Plan Total Prescribed Dose: 45 Gy
Reference Point Dosage Given to Date: 5.4 Gy
Reference Point Session Dosage Given: 1.8 Gy
Session Number: 3

## 2023-05-14 ENCOUNTER — Ambulatory Visit
Admission: RE | Admit: 2023-05-14 | Discharge: 2023-05-14 | Disposition: A | Discharge status: Home / Self Care | Payer: Medicare Other | Source: Ambulatory Visit | Attending: Radiation Oncology | Admitting: Radiation Oncology

## 2023-05-14 ENCOUNTER — Other Ambulatory Visit: Payer: Self-pay

## 2023-05-14 DIAGNOSIS — C61 Malignant neoplasm of prostate: Secondary | ICD-10-CM | POA: Diagnosis not present

## 2023-05-14 LAB — RAD ONC ARIA SESSION SUMMARY
Course Elapsed Days: 3
Plan Fractions Treated to Date: 4
Plan Prescribed Dose Per Fraction: 1.8 Gy
Plan Total Fractions Prescribed: 25
Plan Total Prescribed Dose: 45 Gy
Reference Point Dosage Given to Date: 7.2 Gy
Reference Point Session Dosage Given: 1.8 Gy
Session Number: 4

## 2023-05-15 ENCOUNTER — Other Ambulatory Visit: Payer: Self-pay | Admitting: Radiation Oncology

## 2023-05-15 ENCOUNTER — Other Ambulatory Visit: Payer: Self-pay

## 2023-05-15 ENCOUNTER — Ambulatory Visit
Admission: RE | Admit: 2023-05-15 | Discharge: 2023-05-15 | Disposition: A | Discharge status: Home / Self Care | Payer: Medicare Other | Source: Ambulatory Visit | Attending: Radiation Oncology | Admitting: Radiation Oncology

## 2023-05-15 DIAGNOSIS — C61 Malignant neoplasm of prostate: Secondary | ICD-10-CM | POA: Diagnosis not present

## 2023-05-15 LAB — RAD ONC ARIA SESSION SUMMARY
Course Elapsed Days: 4
Plan Fractions Treated to Date: 5
Plan Prescribed Dose Per Fraction: 1.8 Gy
Plan Total Fractions Prescribed: 25
Plan Total Prescribed Dose: 45 Gy
Reference Point Dosage Given to Date: 9 Gy
Reference Point Session Dosage Given: 1.8 Gy
Session Number: 5

## 2023-05-15 MED ORDER — TAMSULOSIN HCL 0.4 MG PO CAPS
0.4000 mg | ORAL_CAPSULE | Freq: Every day | ORAL | 5 refills | Status: AC
Start: 2023-05-15 — End: ?

## 2023-05-18 ENCOUNTER — Other Ambulatory Visit: Payer: Self-pay

## 2023-05-18 ENCOUNTER — Ambulatory Visit
Admission: RE | Admit: 2023-05-18 | Discharge: 2023-05-18 | Disposition: A | Discharge status: Home / Self Care | Payer: Medicare Other | Source: Ambulatory Visit | Attending: Urology | Admitting: Urology

## 2023-05-18 DIAGNOSIS — C61 Malignant neoplasm of prostate: Secondary | ICD-10-CM | POA: Insufficient documentation

## 2023-05-18 LAB — RAD ONC ARIA SESSION SUMMARY
Course Elapsed Days: 7
Plan Fractions Treated to Date: 6
Plan Prescribed Dose Per Fraction: 1.8 Gy
Plan Total Fractions Prescribed: 25
Plan Total Prescribed Dose: 45 Gy
Reference Point Dosage Given to Date: 10.8 Gy
Reference Point Session Dosage Given: 1.8 Gy
Session Number: 6

## 2023-05-19 ENCOUNTER — Other Ambulatory Visit: Payer: Self-pay

## 2023-05-19 ENCOUNTER — Ambulatory Visit
Admission: RE | Admit: 2023-05-19 | Discharge: 2023-05-19 | Disposition: A | Discharge status: Home / Self Care | Payer: Medicare Other | Source: Ambulatory Visit | Attending: Radiation Oncology | Admitting: Radiation Oncology

## 2023-05-19 DIAGNOSIS — C61 Malignant neoplasm of prostate: Secondary | ICD-10-CM | POA: Diagnosis not present

## 2023-05-19 LAB — RAD ONC ARIA SESSION SUMMARY
Course Elapsed Days: 8
Plan Fractions Treated to Date: 7
Plan Prescribed Dose Per Fraction: 1.8 Gy
Plan Total Fractions Prescribed: 25
Plan Total Prescribed Dose: 45 Gy
Reference Point Dosage Given to Date: 12.6 Gy
Reference Point Session Dosage Given: 1.8 Gy
Session Number: 7

## 2023-05-20 ENCOUNTER — Other Ambulatory Visit: Payer: Self-pay

## 2023-05-20 ENCOUNTER — Ambulatory Visit
Admission: RE | Admit: 2023-05-20 | Discharge: 2023-05-20 | Disposition: A | Discharge status: Home / Self Care | Payer: Medicare Other | Source: Ambulatory Visit | Attending: Radiation Oncology

## 2023-05-20 DIAGNOSIS — C61 Malignant neoplasm of prostate: Secondary | ICD-10-CM | POA: Diagnosis not present

## 2023-05-20 LAB — RAD ONC ARIA SESSION SUMMARY
Course Elapsed Days: 9
Plan Fractions Treated to Date: 8
Plan Prescribed Dose Per Fraction: 1.8 Gy
Plan Total Fractions Prescribed: 25
Plan Total Prescribed Dose: 45 Gy
Reference Point Dosage Given to Date: 14.4 Gy
Reference Point Session Dosage Given: 1.8 Gy
Session Number: 8

## 2023-05-21 ENCOUNTER — Ambulatory Visit

## 2023-05-21 ENCOUNTER — Other Ambulatory Visit: Payer: Self-pay

## 2023-05-21 ENCOUNTER — Ambulatory Visit: Payer: Medicare Other

## 2023-05-21 ENCOUNTER — Ambulatory Visit
Admission: RE | Admit: 2023-05-21 | Discharge: 2023-05-21 | Disposition: A | Discharge status: Home / Self Care | Source: Ambulatory Visit | Attending: Radiation Oncology | Admitting: Radiation Oncology

## 2023-05-21 DIAGNOSIS — C61 Malignant neoplasm of prostate: Secondary | ICD-10-CM | POA: Diagnosis not present

## 2023-05-21 LAB — RAD ONC ARIA SESSION SUMMARY
Course Elapsed Days: 10
Plan Fractions Treated to Date: 9
Plan Prescribed Dose Per Fraction: 1.8 Gy
Plan Total Fractions Prescribed: 25
Plan Total Prescribed Dose: 45 Gy
Reference Point Dosage Given to Date: 16.2 Gy
Reference Point Session Dosage Given: 1.8 Gy
Session Number: 9

## 2023-05-22 ENCOUNTER — Ambulatory Visit
Admission: RE | Admit: 2023-05-22 | Discharge: 2023-05-22 | Disposition: A | Discharge status: Home / Self Care | Payer: Medicare Other | Source: Ambulatory Visit | Attending: Radiation Oncology | Admitting: Radiation Oncology

## 2023-05-22 ENCOUNTER — Other Ambulatory Visit: Payer: Self-pay

## 2023-05-22 DIAGNOSIS — C61 Malignant neoplasm of prostate: Secondary | ICD-10-CM | POA: Diagnosis not present

## 2023-05-22 LAB — RAD ONC ARIA SESSION SUMMARY
Course Elapsed Days: 11
Plan Fractions Treated to Date: 10
Plan Prescribed Dose Per Fraction: 1.8 Gy
Plan Total Fractions Prescribed: 25
Plan Total Prescribed Dose: 45 Gy
Reference Point Dosage Given to Date: 18 Gy
Reference Point Session Dosage Given: 1.8 Gy
Session Number: 10

## 2023-05-25 ENCOUNTER — Other Ambulatory Visit: Payer: Self-pay

## 2023-05-25 ENCOUNTER — Ambulatory Visit
Admission: RE | Admit: 2023-05-25 | Discharge: 2023-05-25 | Disposition: A | Discharge status: Home / Self Care | Payer: Medicare Other | Source: Ambulatory Visit | Attending: Radiation Oncology | Admitting: Radiation Oncology

## 2023-05-25 DIAGNOSIS — C61 Malignant neoplasm of prostate: Secondary | ICD-10-CM | POA: Diagnosis not present

## 2023-05-25 LAB — RAD ONC ARIA SESSION SUMMARY
Course Elapsed Days: 14
Plan Fractions Treated to Date: 11
Plan Prescribed Dose Per Fraction: 1.8 Gy
Plan Total Fractions Prescribed: 25
Plan Total Prescribed Dose: 45 Gy
Reference Point Dosage Given to Date: 19.8 Gy
Reference Point Session Dosage Given: 1.8 Gy
Session Number: 11

## 2023-05-26 ENCOUNTER — Ambulatory Visit
Admission: RE | Admit: 2023-05-26 | Discharge: 2023-05-26 | Disposition: A | Discharge status: Home / Self Care | Payer: Medicare Other | Source: Ambulatory Visit | Attending: Radiation Oncology | Admitting: Radiation Oncology

## 2023-05-26 ENCOUNTER — Other Ambulatory Visit: Payer: Self-pay

## 2023-05-26 DIAGNOSIS — C61 Malignant neoplasm of prostate: Secondary | ICD-10-CM | POA: Diagnosis not present

## 2023-05-26 LAB — RAD ONC ARIA SESSION SUMMARY
Course Elapsed Days: 15
Plan Fractions Treated to Date: 12
Plan Prescribed Dose Per Fraction: 1.8 Gy
Plan Total Fractions Prescribed: 25
Plan Total Prescribed Dose: 45 Gy
Reference Point Dosage Given to Date: 21.6 Gy
Reference Point Session Dosage Given: 1.8 Gy
Session Number: 12

## 2023-05-27 ENCOUNTER — Other Ambulatory Visit: Payer: Self-pay

## 2023-05-27 ENCOUNTER — Ambulatory Visit
Admission: RE | Admit: 2023-05-27 | Discharge: 2023-05-27 | Disposition: A | Discharge status: Home / Self Care | Payer: Medicare Other | Source: Ambulatory Visit | Attending: Radiation Oncology

## 2023-05-27 DIAGNOSIS — C61 Malignant neoplasm of prostate: Secondary | ICD-10-CM | POA: Diagnosis not present

## 2023-05-27 LAB — RAD ONC ARIA SESSION SUMMARY
Course Elapsed Days: 16
Plan Fractions Treated to Date: 13
Plan Prescribed Dose Per Fraction: 1.8 Gy
Plan Total Fractions Prescribed: 25
Plan Total Prescribed Dose: 45 Gy
Reference Point Dosage Given to Date: 23.4 Gy
Reference Point Session Dosage Given: 1.8 Gy
Session Number: 13

## 2023-05-28 ENCOUNTER — Other Ambulatory Visit: Payer: Self-pay

## 2023-05-28 ENCOUNTER — Ambulatory Visit
Admission: RE | Admit: 2023-05-28 | Discharge: 2023-05-28 | Disposition: A | Discharge status: Home / Self Care | Payer: Medicare Other | Source: Ambulatory Visit | Attending: Radiation Oncology | Admitting: Radiation Oncology

## 2023-05-28 DIAGNOSIS — C61 Malignant neoplasm of prostate: Secondary | ICD-10-CM | POA: Diagnosis not present

## 2023-05-28 LAB — RAD ONC ARIA SESSION SUMMARY
Course Elapsed Days: 17
Plan Fractions Treated to Date: 14
Plan Prescribed Dose Per Fraction: 1.8 Gy
Plan Total Fractions Prescribed: 25
Plan Total Prescribed Dose: 45 Gy
Reference Point Dosage Given to Date: 25.2 Gy
Reference Point Session Dosage Given: 1.8 Gy
Session Number: 14

## 2023-05-29 ENCOUNTER — Ambulatory Visit
Admission: RE | Admit: 2023-05-29 | Discharge: 2023-05-29 | Disposition: A | Discharge status: Home / Self Care | Payer: Medicare Other | Source: Ambulatory Visit | Attending: Radiation Oncology | Admitting: Radiation Oncology

## 2023-05-29 ENCOUNTER — Ambulatory Visit
Admission: RE | Admit: 2023-05-29 | Discharge: 2023-05-29 | Disposition: A | Discharge status: Home / Self Care | Source: Ambulatory Visit | Attending: Radiation Oncology | Admitting: Radiation Oncology

## 2023-05-29 ENCOUNTER — Other Ambulatory Visit: Payer: Self-pay

## 2023-05-29 DIAGNOSIS — C61 Malignant neoplasm of prostate: Secondary | ICD-10-CM | POA: Diagnosis not present

## 2023-05-29 LAB — RAD ONC ARIA SESSION SUMMARY
Course Elapsed Days: 18
Plan Fractions Treated to Date: 15
Plan Prescribed Dose Per Fraction: 1.8 Gy
Plan Total Fractions Prescribed: 25
Plan Total Prescribed Dose: 45 Gy
Reference Point Dosage Given to Date: 27 Gy
Reference Point Session Dosage Given: 1.8 Gy
Session Number: 15

## 2023-06-01 ENCOUNTER — Ambulatory Visit
Admission: RE | Admit: 2023-06-01 | Discharge: 2023-06-01 | Disposition: A | Discharge status: Home / Self Care | Payer: Medicare Other | Source: Ambulatory Visit | Attending: Radiation Oncology | Admitting: Radiation Oncology

## 2023-06-01 ENCOUNTER — Other Ambulatory Visit: Payer: Self-pay

## 2023-06-01 DIAGNOSIS — C61 Malignant neoplasm of prostate: Secondary | ICD-10-CM | POA: Diagnosis not present

## 2023-06-01 LAB — RAD ONC ARIA SESSION SUMMARY
Course Elapsed Days: 21
Plan Fractions Treated to Date: 16
Plan Prescribed Dose Per Fraction: 1.8 Gy
Plan Total Fractions Prescribed: 25
Plan Total Prescribed Dose: 45 Gy
Reference Point Dosage Given to Date: 28.8 Gy
Reference Point Session Dosage Given: 1.8 Gy
Session Number: 16

## 2023-06-02 ENCOUNTER — Ambulatory Visit
Admission: RE | Admit: 2023-06-02 | Discharge: 2023-06-02 | Disposition: A | Discharge status: Home / Self Care | Payer: Medicare Other | Source: Ambulatory Visit | Attending: Radiation Oncology | Admitting: Radiation Oncology

## 2023-06-02 ENCOUNTER — Other Ambulatory Visit: Payer: Self-pay

## 2023-06-02 DIAGNOSIS — C61 Malignant neoplasm of prostate: Secondary | ICD-10-CM | POA: Diagnosis not present

## 2023-06-02 LAB — RAD ONC ARIA SESSION SUMMARY
Course Elapsed Days: 22
Plan Fractions Treated to Date: 17
Plan Prescribed Dose Per Fraction: 1.8 Gy
Plan Total Fractions Prescribed: 25
Plan Total Prescribed Dose: 45 Gy
Reference Point Dosage Given to Date: 30.6 Gy
Reference Point Session Dosage Given: 1.8 Gy
Session Number: 17

## 2023-06-03 ENCOUNTER — Other Ambulatory Visit: Payer: Self-pay

## 2023-06-03 ENCOUNTER — Ambulatory Visit
Admission: RE | Admit: 2023-06-03 | Discharge: 2023-06-03 | Disposition: A | Discharge status: Home / Self Care | Payer: Medicare Other | Source: Ambulatory Visit | Attending: Radiation Oncology | Admitting: Radiation Oncology

## 2023-06-03 DIAGNOSIS — C61 Malignant neoplasm of prostate: Secondary | ICD-10-CM | POA: Diagnosis not present

## 2023-06-03 LAB — RAD ONC ARIA SESSION SUMMARY
Course Elapsed Days: 23
Plan Fractions Treated to Date: 18
Plan Prescribed Dose Per Fraction: 1.8 Gy
Plan Total Fractions Prescribed: 25
Plan Total Prescribed Dose: 45 Gy
Reference Point Dosage Given to Date: 32.4 Gy
Reference Point Session Dosage Given: 1.8 Gy
Session Number: 18

## 2023-06-04 ENCOUNTER — Other Ambulatory Visit: Payer: Self-pay

## 2023-06-04 ENCOUNTER — Ambulatory Visit
Admission: RE | Admit: 2023-06-04 | Discharge: 2023-06-04 | Disposition: A | Discharge status: Home / Self Care | Payer: Medicare Other | Source: Ambulatory Visit | Attending: Radiation Oncology | Admitting: Radiation Oncology

## 2023-06-04 DIAGNOSIS — C61 Malignant neoplasm of prostate: Secondary | ICD-10-CM | POA: Diagnosis not present

## 2023-06-04 LAB — RAD ONC ARIA SESSION SUMMARY
Course Elapsed Days: 24
Plan Fractions Treated to Date: 19
Plan Prescribed Dose Per Fraction: 1.8 Gy
Plan Total Fractions Prescribed: 25
Plan Total Prescribed Dose: 45 Gy
Reference Point Dosage Given to Date: 34.2 Gy
Reference Point Session Dosage Given: 1.8 Gy
Session Number: 19

## 2023-06-05 ENCOUNTER — Other Ambulatory Visit: Payer: Self-pay

## 2023-06-05 ENCOUNTER — Ambulatory Visit
Admission: RE | Admit: 2023-06-05 | Discharge: 2023-06-05 | Disposition: A | Discharge status: Home / Self Care | Payer: Medicare Other | Source: Ambulatory Visit | Attending: Radiation Oncology | Admitting: Radiation Oncology

## 2023-06-05 ENCOUNTER — Ambulatory Visit
Admission: RE | Admit: 2023-06-05 | Discharge: 2023-06-05 | Disposition: A | Discharge status: Home / Self Care | Source: Ambulatory Visit | Attending: Radiation Oncology | Admitting: Radiation Oncology

## 2023-06-05 DIAGNOSIS — C61 Malignant neoplasm of prostate: Secondary | ICD-10-CM | POA: Diagnosis not present

## 2023-06-05 LAB — RAD ONC ARIA SESSION SUMMARY
Course Elapsed Days: 25
Plan Fractions Treated to Date: 20
Plan Prescribed Dose Per Fraction: 1.8 Gy
Plan Total Fractions Prescribed: 25
Plan Total Prescribed Dose: 45 Gy
Reference Point Dosage Given to Date: 36 Gy
Reference Point Session Dosage Given: 1.8 Gy
Session Number: 20

## 2023-06-08 ENCOUNTER — Other Ambulatory Visit: Payer: Self-pay

## 2023-06-08 ENCOUNTER — Ambulatory Visit
Admission: RE | Admit: 2023-06-08 | Discharge: 2023-06-08 | Disposition: A | Discharge status: Home / Self Care | Payer: Medicare Other | Source: Ambulatory Visit | Attending: Radiation Oncology | Admitting: Radiation Oncology

## 2023-06-08 DIAGNOSIS — C61 Malignant neoplasm of prostate: Secondary | ICD-10-CM | POA: Diagnosis not present

## 2023-06-08 LAB — RAD ONC ARIA SESSION SUMMARY
Course Elapsed Days: 28
Plan Fractions Treated to Date: 21
Plan Prescribed Dose Per Fraction: 1.8 Gy
Plan Total Fractions Prescribed: 25
Plan Total Prescribed Dose: 45 Gy
Reference Point Dosage Given to Date: 37.8 Gy
Reference Point Session Dosage Given: 1.8 Gy
Session Number: 21

## 2023-06-09 ENCOUNTER — Other Ambulatory Visit: Payer: Self-pay

## 2023-06-09 ENCOUNTER — Ambulatory Visit: Payer: Medicare Other

## 2023-06-09 ENCOUNTER — Ambulatory Visit
Admission: RE | Admit: 2023-06-09 | Discharge: 2023-06-09 | Disposition: A | Discharge status: Home / Self Care | Payer: Medicare Other | Source: Ambulatory Visit | Attending: Radiation Oncology

## 2023-06-09 DIAGNOSIS — C61 Malignant neoplasm of prostate: Secondary | ICD-10-CM | POA: Diagnosis not present

## 2023-06-09 LAB — RAD ONC ARIA SESSION SUMMARY
Course Elapsed Days: 29
Plan Fractions Treated to Date: 22
Plan Prescribed Dose Per Fraction: 1.8 Gy
Plan Total Fractions Prescribed: 25
Plan Total Prescribed Dose: 45 Gy
Reference Point Dosage Given to Date: 39.6 Gy
Reference Point Session Dosage Given: 1.8 Gy
Session Number: 22

## 2023-06-10 ENCOUNTER — Other Ambulatory Visit: Payer: Self-pay

## 2023-06-10 ENCOUNTER — Ambulatory Visit
Admission: RE | Admit: 2023-06-10 | Discharge: 2023-06-10 | Disposition: A | Discharge status: Home / Self Care | Payer: Medicare Other | Source: Ambulatory Visit | Attending: Radiation Oncology | Admitting: Radiation Oncology

## 2023-06-10 DIAGNOSIS — C61 Malignant neoplasm of prostate: Secondary | ICD-10-CM | POA: Diagnosis not present

## 2023-06-10 LAB — RAD ONC ARIA SESSION SUMMARY
Course Elapsed Days: 30
Plan Fractions Treated to Date: 23
Plan Prescribed Dose Per Fraction: 1.8 Gy
Plan Total Fractions Prescribed: 25
Plan Total Prescribed Dose: 45 Gy
Reference Point Dosage Given to Date: 41.4 Gy
Reference Point Session Dosage Given: 1.8 Gy
Session Number: 23

## 2023-06-11 ENCOUNTER — Other Ambulatory Visit: Payer: Self-pay

## 2023-06-11 ENCOUNTER — Ambulatory Visit
Admission: RE | Admit: 2023-06-11 | Discharge: 2023-06-11 | Disposition: A | Discharge status: Home / Self Care | Source: Ambulatory Visit | Attending: Radiation Oncology

## 2023-06-11 ENCOUNTER — Ambulatory Visit
Admission: RE | Admit: 2023-06-11 | Discharge: 2023-06-11 | Disposition: A | Discharge status: Home / Self Care | Payer: Medicare Other | Source: Ambulatory Visit | Attending: Radiation Oncology

## 2023-06-11 DIAGNOSIS — C61 Malignant neoplasm of prostate: Secondary | ICD-10-CM | POA: Diagnosis not present

## 2023-06-11 LAB — RAD ONC ARIA SESSION SUMMARY
Course Elapsed Days: 31
Plan Fractions Treated to Date: 24
Plan Prescribed Dose Per Fraction: 1.8 Gy
Plan Total Fractions Prescribed: 25
Plan Total Prescribed Dose: 45 Gy
Reference Point Dosage Given to Date: 43.2 Gy
Reference Point Session Dosage Given: 1.8 Gy
Session Number: 24

## 2023-06-12 ENCOUNTER — Ambulatory Visit
Admission: RE | Admit: 2023-06-12 | Discharge: 2023-06-12 | Disposition: A | Discharge status: Home / Self Care | Source: Ambulatory Visit | Attending: Radiation Oncology | Admitting: Radiation Oncology

## 2023-06-12 ENCOUNTER — Ambulatory Visit: Payer: Medicare Other

## 2023-06-12 ENCOUNTER — Other Ambulatory Visit: Payer: Self-pay

## 2023-06-12 ENCOUNTER — Ambulatory Visit

## 2023-06-12 DIAGNOSIS — C61 Malignant neoplasm of prostate: Secondary | ICD-10-CM | POA: Diagnosis not present

## 2023-06-12 LAB — RAD ONC ARIA SESSION SUMMARY
Course Elapsed Days: 32
Plan Fractions Treated to Date: 25
Plan Prescribed Dose Per Fraction: 1.8 Gy
Plan Total Fractions Prescribed: 25
Plan Total Prescribed Dose: 45 Gy
Reference Point Dosage Given to Date: 45 Gy
Reference Point Session Dosage Given: 1.8 Gy
Session Number: 25

## 2023-06-15 ENCOUNTER — Ambulatory Visit

## 2023-06-15 NOTE — Radiation Completion Notes (Addendum)
  Radiation Oncology         [phone] ________________________________  Name: Mitchell Ross MRN: [id number]  Date: 06/12/2023  DOB: 09/13/1955  Referring Physician: Kristeen Peto, M.D. Date of Service: 2023-06-15 Radiation Oncologist: Bartholome Ligas, M.D. [facility]     RADIATION ONCOLOGY END OF TREATMENT NOTE     Diagnosis: 68 y.o. gentleman with locally advanced, Stage T3b adenocarcinoma of the prostate involving the left seminal vesicle with Gleason score of 4+3, and PSA of 6.9.   Intent: Curative     ==========DELIVERED PLANS==========  First Treatment Date: 2023-05-11 Last Treatment Date: 2023-06-12   Plan Name: Prostate_Pelv Site: Prostate Technique: IMRT Mode: Photon Dose Per Fraction: 1.8 Gy Prescribed Dose (Delivered / Prescribed): 45 Gy / 45 Gy Prescribed Fxs (Delivered / Prescribed): 25 / 25   Plan Name: Prostate Seed Implant Site: Prostate Technique: Radioactive Seed Implant I-125 Mode: Brachytherapy Dose Per Fraction: 110 Gy Prescribed Dose (Delivered / Prescribed): 110 Gy / 110 Gy Prescribed Fxs (Delivered / Prescribed): 1 / 1     ==========ON TREATMENT VISIT DATES========== 2023-05-15, 2023-05-22, 2023-05-29, 2023-06-05, 2023-06-11    See weekly On Treatment Notes in Epic for details in the Media tab (listed as Progress notes on the On Treatment Visit Dates listed above).  He tolerated the radiation treatments relatively well with only mild increased LUTS that were managed with Flomax  daily.  He also experienced some occasional diarrhea that was managed with Pepto as needed.  The patient will receive a call in about one month from the radiation oncology department. He will continue follow up with his urologist, Dr. Rozanne Corners, as well.  ------------------------------------------------   Kenith Payer, MD [facility]  Radiation Oncology Direct Dial: [phone]  Fax: [phone] [email or web address]  Skype  [email or web address]

## 2023-06-22 NOTE — Progress Notes (Signed)
 Patient was a RadOnc Consult on 12/30/22 for his stage T3b adenocarcinoma of the prostate involving the left seminal vesicle with Gleason score of 4+3, and PSA of 6.9. Patient proceed with treatment recommendations of brachytherapy boost and use of SpaceOAR gel followed by a 5 week course of daily radiotherapy concurrent with LT-ADT and had his final radiation treatment on 06/12/23.   Patient is scheduled for a post treatment nurse call on 07/14/23 and has his first post treatment PSA on 5/14 at [facility].

## 2023-07-10 NOTE — Progress Notes (Addendum)
  Radiation Oncology         [phone] ________________________________  Name: Mitchell Ross MRN: [id number]  Date of Service: 07/10/2023  DOB: May 18, 1955  Post Treatment Telephone Note  Diagnosis:  C61 Malignant neoplasm of prostate (as documented in provider EOT note)  Pre Treatment IPSS Score: 13 (as documented in the provider consult note)  The patient was available for call today.   Symptoms of fatigue have improved since completing therapy.  Symptoms of bladder changes have improved since completing therapy. Current symptoms include Polyuria, and medications for bladder symptoms include Tamsulosin .  Symptoms of bowel changes have improved since completing therapy. Current symptoms include diarrhea/ constipation, and medications for bowel symptoms include Miralax .   Post Treatment IPSS Score: IPSS Questionnaire (AUA-7): Over the past month.   1)  How often have you had a sensation of not emptying your bladder completely after you finish urinating?  1 - Less than 1 time in 5  2)  How often have you had to urinate again less than two hours after you finished urinating? 1 - Less than 1 time in 5  3)  How often have you found you stopped and started again several times when you urinated?  3 - About half the time  4) How difficult have you found it to postpone urination?  1 - Less than 1 time in 5  5) How often have you had a weak urinary stream?  5 - Almost always  6) How often have you had to push or strain to begin urination?  1 - Less than 1 time in 5  7) How many times did you most typically get up to urinate from the time you went to bed until the time you got up in the morning?  5 - 5+ times  Total score:  17. Which indicates moderate symptoms  0-7 mildly symptomatic   8-19 moderately symptomatic   20-35 severely symptomatic   Patient has a scheduled follow up visit with his urologist, Dr. Rozanne Corners, on 07/29/2023 for ongoing surveillance. He was counseled that PSA levels  will be drawn in the urology office, and was reassured that additional time is expected to improve bowel and bladder symptoms. He was encouraged to call back with concerns or questions regarding radiation.   This concludes the interaction.  Avery Bodo, LPN

## 2023-07-14 ENCOUNTER — Ambulatory Visit
Admission: RE | Admit: 2023-07-14 | Discharge: 2023-07-14 | Disposition: A | Discharge status: Home / Self Care | Source: Ambulatory Visit | Attending: Urology | Admitting: Urology

## 2023-07-14 DIAGNOSIS — C61 Malignant neoplasm of prostate: Secondary | ICD-10-CM | POA: Insufficient documentation

## 2023-07-28 ENCOUNTER — Other Ambulatory Visit: Payer: Self-pay | Admitting: Urology

## 2023-07-28 DIAGNOSIS — C61 Malignant neoplasm of prostate: Secondary | ICD-10-CM

## 2023-08-11 ENCOUNTER — Encounter: Payer: Self-pay | Admitting: *Deleted

## 2023-09-04 ENCOUNTER — Encounter: Payer: Self-pay | Admitting: *Deleted

## 2023-09-04 NOTE — Progress Notes (Signed)
 SCP summary mailed to patient.

## 2023-09-10 ENCOUNTER — Inpatient Hospital Stay: Attending: Radiation Oncology | Admitting: *Deleted

## 2023-09-10 ENCOUNTER — Encounter: Payer: Self-pay | Admitting: *Deleted

## 2023-09-10 DIAGNOSIS — C61 Malignant neoplasm of prostate: Secondary | ICD-10-CM

## 2023-09-10 NOTE — Progress Notes (Signed)
 SCP reviewed and completed. Allergies and meds reviewed and updated. Pt admits to smoking 2 cigarettes a day. Navigator offered our smoking cessation to pt, he declined.Pt admits to drinking 5 beers a week. Vaccines discussed and suggested. Pt did not take a flu vaccine last year. Pt will be changing PCP due to his retiring. New PCP will be Dr. Onita at [facility]. Next well-checkup will be in August. Last colonoscopy on 06/21/2019. Repeat 06/2024. Most recent PSA was <0.015 on 07/29/2023. Next PSA to be done at [facility] in November.

## 2023-12-07 ENCOUNTER — Encounter: Payer: Self-pay | Admitting: *Deleted

## 2023-12-21 ENCOUNTER — Ambulatory Visit (INDEPENDENT_AMBULATORY_CARE_PROVIDER_SITE_OTHER): Admitting: Student

## 2023-12-21 ENCOUNTER — Ambulatory Visit (INDEPENDENT_AMBULATORY_CARE_PROVIDER_SITE_OTHER)

## 2023-12-21 ENCOUNTER — Other Ambulatory Visit (HOSPITAL_BASED_OUTPATIENT_CLINIC_OR_DEPARTMENT_OTHER): Payer: Self-pay

## 2023-12-21 DIAGNOSIS — M545 Low back pain, unspecified: Secondary | ICD-10-CM

## 2023-12-21 DIAGNOSIS — M5442 Lumbago with sciatica, left side: Secondary | ICD-10-CM | POA: Diagnosis not present

## 2023-12-21 MED ORDER — METHYLPREDNISOLONE 4 MG PO TBPK
ORAL_TABLET | ORAL | 0 refills | Status: AC
  Filled 2023-12-21: qty 21, 6d supply, fill #0

## 2023-12-21 MED ORDER — METHOCARBAMOL 500 MG PO TABS
500.0000 mg | ORAL_TABLET | Freq: Four times a day (QID) | ORAL | 0 refills | Status: AC
  Filled 2023-12-21: qty 40, 10d supply, fill #0

## 2023-12-21 NOTE — Progress Notes (Signed)
 Chief Complaint: Left hip and leg pain    Discussed the use of AI scribe software for clinical note transcription with the patient, who gave verbal consent to proceed.  History of Present Illness Mitchell MARKEN is a 68 year old male who presents with acute low back and buttock pain radiating to the knee. He experienced an acute onset of sharp, throbbing low back and buttock pain over the weekend, radiating to just below the knee. The pain causes significant discomfort with movement and disrupts sleep, with lying down feeling like a stab. He reports intermittent numbness and tingling. He uses ice packs, heat, ibuprofen, and oxycodone  for relief, with heat providing temporary relief for about ten to fifteen minutes. He takes one oxycodone  per day. He inquires whether activities such as riding a lawnmower could aggravate his condition.   Surgical History:   None  PMH/PSH/Family History/Social History/Meds/Allergies:    Past Medical History:  Diagnosis Date   Benign localized prostatic hyperplasia with lower urinary tract symptoms (LUTS)    Chronic left hip pain    04-10-2023 taking daily oxycodone    ED (erectile dysfunction)    History of adenomatous polyp of colon    History of concussion 05/27/2012   w/ brief LOC with neck injury,  no residual   History of hepatitis C 2007   dx 2007  liver bx 02-04-2006 chronic mild hepatitis w/ hepatitis C, genotype 1A;  treated [phone], pt stated treatment failure 1 yr later;  folllow-up w/ ID, Veva Buba NP, 04/ 2015 started Harvoni 06/ 2015 completed 09/ 2015,  HCV RNA undetectable after completion and 04/ 2016   Hyperlipidemia    Hypertension    Malignant neoplasm prostate (HCC) 10/2022   urologist--- dr borden/  radiation onologist--- dr patrcia;   gleason 4+3,  psa 6.9,  vol 44.7cc  ;   brachytherapy scheduled for 04-13-2023   OA (osteoarthritis)    Type 2 diabetes mellitus (HCC)    followed by pcp    (04-10-2023  per pt does not check blood sugar at home)   Past Surgical History:  Procedure Laterality Date   ANTERIOR CERVICAL DECOMP/DISCECTOMY FUSION N/A 01/20/2013   Procedure: ANTERIOR CERVICAL DECOMPRESSION/DISCECTOMY FUSION 1 LEVEL;  Surgeon: Fairy Levels, MD;  Location: [facility];  Service: Neurosurgery;  Laterality: N/A;  C3-4 Anterior cervical decompression/diskectomy/fusion   ANTERIOR CRUCIATE LIGAMENT REPAIR Right 11/15/1968   BIOPSY  03/03/2016   Procedure: BIOPSY;  Surgeon: Margo LITTIE Haddock, MD;  Location: [facility];  Service: Endoscopy;;  polypectomy by biopsy at ascending colon   COLONOSCOPY N/A 03/03/2016   Procedure: COLONOSCOPY;  Surgeon: Margo LITTIE Haddock, MD;  Location: [facility];  Service: Endoscopy;  Laterality: N/A;  8:30 Am   COLONOSCOPY WITH PROPOFOL  N/A 06/21/2019   Procedure: COLONOSCOPY WITH PROPOFOL ;  Surgeon: Haddock Margo LITTIE, MD;  Location: [facility];  Service: Endoscopy;  Laterality: N/A;  12:00pm   FLEXIBLE SIGMOIDOSCOPY N/A 12/11/2016   Procedure: FLEXIBLE SIGMOIDOSCOPY;  Surgeon: Haddock Margo LITTIE, MD;  Location: [facility];  Service: Endoscopy;  Laterality: N/A;  730    HEMORRHOID BANDING N/A 12/11/2016   Procedure: HEMORRHOID BANDING;  Surgeon: Haddock Margo LITTIE, MD;  Location: [facility];  Service: Endoscopy;  Laterality: N/A;   POLYPECTOMY  03/03/2016   Procedure: POLYPECTOMY;  Surgeon:  Margo LITTIE Haddock, MD;  Location: [facility];  Service: Endoscopy;;  transverse x3; rectal and sigmoid polyps;   POLYPECTOMY  06/21/2019   Procedure: POLYPECTOMY;  Surgeon: Haddock Margo LITTIE, MD;  Location: [facility];  Service: Endoscopy;;   RADIOACTIVE SEED IMPLANT N/A 04/13/2023   Procedure: RADIOACTIVE SEED IMPLANT/BRACHYTHERAPY IMPLANT;  Surgeon: Renda Glance, MD;  Location: [facility] [place];  Service: Urology;  Laterality: N/A;   REVISION AMPUTATION OF FINGER Right 1997   right index finger tip   Social History   Socioeconomic History    Marital status: Divorced    Spouse name: Not on file   Number of children: Not on file   Years of education: Not on file   Highest education level: Not on file  Occupational History   Not on file  Tobacco Use   Smoking status: Every Day    Current packs/day: 0.20    Average packs/day: 0.2 packs/day for 30.0 years (6.0 ttl pk-yrs)    Types: Cigarettes   Smokeless tobacco: Never   Tobacco comments:    04-10-2023  per pt smoked 1-2 cig per day,   started age 64    09/10/2023 per pt smoked  2 cigarette per day  Vaping Use   Vaping status: Never Used  Substance and Sexual Activity   Alcohol use: Yes    Alcohol/week: 6.0 standard drinks of alcohol    Types: 6 Cans of beer per week    Comment: 6 pack beer/week   Drug use: Never   Sexual activity: Not on file  Other Topics Concern   Not on file  Social History Narrative   Not on file   Social Drivers of Health   Financial Resource Strain: Not on file  Food Insecurity: No Food Insecurity (12/30/2022)   Hunger Vital Sign    Worried About Running Out of Food in the Last Year: Never true    Ran Out of Food in the Last Year: Never true  Transportation Needs: No Transportation Needs (12/30/2022)   PRAPARE - Administrator, Civil Service (Medical): No    Lack of Transportation (Non-Medical): No  Physical Activity: Not on file  Stress: Not on file  Social Connections: Not on file   Family History  Problem Relation Age of Onset   Stomach cancer Mother    Prostate cancer Father    Cancer Other    Arthritis Other    Colon cancer Neg Hx    No Known Allergies Current Outpatient Medications  Medication Sig Dispense Refill   methocarbamol (ROBAXIN) 500 MG tablet Take 1 tablet (500 mg total) by mouth 4 (four) times daily for 10 days. 40 tablet 0   methylPREDNISolone  (MEDROL  DOSEPAK) 4 MG TBPK tablet Take per packet instructions 21 each 0   amLODipine (NORVASC) 10 MG tablet Take 10 mg by mouth daily.     cholecalciferol  (VITAMIN D3) 25 MCG (1000 UNIT) tablet Take 1,000 Units by mouth daily.     Coenzyme Q10 (CO Q-10) 100 MG CAPS Take 100 mg by mouth daily.     meloxicam (MOBIC) 7.5 MG tablet Take 7.5 mg by mouth 2 (two) times daily.     metFORMIN (GLUCOPHAGE) 500 MG tablet Take 500 mg by mouth 2 (two) times daily.     Multiple Vitamin (MULTIVITAMIN WITH MINERALS) TABS tablet Take 1 tablet by mouth daily.     oxyCODONE -acetaminophen  (PERCOCET) 10-325 MG tablet Take 1 tablet by mouth daily.  rosuvastatin (CRESTOR) 10 MG tablet Take 10 mg by mouth at bedtime.     Saw Palmetto 450 MG CAPS Take 900 mg by mouth daily.     selenium  200 MCG TABS tablet Take 200 mcg by mouth daily.      tamsulosin  (FLOMAX ) 0.4 MG CAPS capsule Take 1 capsule (0.4 mg total) by mouth at bedtime. 30 capsule 5   traMADol  (ULTRAM ) 50 MG tablet Take 1-2 tablets (50-100 mg total) by mouth every 6 (six) hours as needed (pain). (Patient not taking: Reported on 09/10/2023) 8 tablet 0   Turmeric 500 MG TABS Take 500 mg by mouth daily.  (Patient not taking: Reported on 09/10/2023)     zinc  gluconate 50 MG tablet Take 50 mg by mouth daily.     No current facility-administered medications for this visit.   No results found.  Review of Systems:   A ROS was performed including pertinent positives and negatives as documented in the HPI.  Physical Exam :   Constitutional: NAD and appears stated age Neurological: Alert and oriented Psych: Appropriate affect and cooperative There were no vitals taken for this visit.   Comprehensive Musculoskeletal Exam:    No midline tenderness of the lumbar spine.  There is tenderness in the left lower lumbar musculature extending over the lateral hip.  Pain slightly increased with passive flexion of the hip.  Bilateral knee flexion/extension and ankle dorsiflexion/plantarflexion strength is 5/5.  Imaging:   Xray (lumbar spine 4 views): Grade 2 anterolisthesis of L4 on L5.  Decreased disc spacing noted at  L4-L5 and L5-S1.   I personally reviewed and interpreted the radiographs.      Assessment & Plan Lumbar radiculopathy He presents with acute lumbar radiculopathy, experiencing pain radiating from the low back to the buttocks and slightly past the knee, accompanied by numbness and tingling. X-ray reveals grade 1-2 spondylolisthesis at L4-L5.  No strength deficits in the lower extremity.  Conservative management is preferred.  Will begin a Medrol  Dosepak as well as Robaxin to use as needed for symptom relief.  Will monitor for symptom relief and would consider referral to Dr. Georgina for potential physical therapy if symptoms persist.      I personally saw and evaluated the patient, and participated in the management and treatment plan.  Leonce Reveal, PA-C Orthopedics

## 2023-12-22 ENCOUNTER — Telehealth (HOSPITAL_BASED_OUTPATIENT_CLINIC_OR_DEPARTMENT_OTHER): Payer: Self-pay | Admitting: Student

## 2023-12-22 NOTE — Telephone Encounter (Signed)
 Patient wants to know if her orders a brace off of [facility] will will that help is situation. Best contact [phone]

## 2023-12-22 NOTE — Telephone Encounter (Signed)
**Note De-identified  [place] Obfuscation** Please advise 

## 2023-12-24 NOTE — Telephone Encounter (Signed)
 Called and discussed.  Patient is doing a bit better symptomatically and I told him he can call back and let us  know if he would like a referral to physical therapy.

## 2023-12-29 ENCOUNTER — Telehealth: Payer: Self-pay | Admitting: Orthopedic Surgery

## 2023-12-29 NOTE — Telephone Encounter (Signed)
 Spoke w/the pt's sister Lonell, she stated that the pt went to [facility] on 12/21/23 for his back bc we didn't have any openings at the time.  They did x-rays there as well.  She stated he is still having pain and really wants to see you.  Ok to schedule w/you?

## 2023-12-29 NOTE — Telephone Encounter (Signed)
 Spoke w/Jackie and advised.  Gave her the # for [facility], offered to transfer, she is going to speak w/the pt and see what he wants to do.  She verbalized understanding.

## 2024-01-18 ENCOUNTER — Encounter: Payer: Self-pay | Admitting: Radiology

## 2024-02-01 ENCOUNTER — Ambulatory Visit: Admitting: Orthopedic Surgery

## 2024-04-19 ENCOUNTER — Other Ambulatory Visit (HOSPITAL_BASED_OUTPATIENT_CLINIC_OR_DEPARTMENT_OTHER): Payer: Self-pay
